# Patient Record
Sex: Male | Born: 1940 | Race: White | Hispanic: No | Smoking: Former smoker
Health system: Southern US, Community
[De-identification: ages and names within clinical notes are randomized; demographics above are authoritative.]

## PROBLEM LIST (undated history)

## (undated) DIAGNOSIS — I452 Bifascicular block: Secondary | ICD-10-CM

## (undated) DIAGNOSIS — D649 Anemia, unspecified: Secondary | ICD-10-CM

## (undated) DIAGNOSIS — N281 Cyst of kidney, acquired: Secondary | ICD-10-CM

## (undated) DIAGNOSIS — N400 Enlarged prostate without lower urinary tract symptoms: Secondary | ICD-10-CM

## (undated) DIAGNOSIS — I219 Acute myocardial infarction, unspecified: Secondary | ICD-10-CM

## (undated) DIAGNOSIS — IMO0001 Reserved for inherently not codable concepts without codable children: Secondary | ICD-10-CM

## (undated) DIAGNOSIS — S4292XA Fracture of left shoulder girdle, part unspecified, initial encounter for closed fracture: Secondary | ICD-10-CM

## (undated) DIAGNOSIS — K219 Gastro-esophageal reflux disease without esophagitis: Secondary | ICD-10-CM

## (undated) DIAGNOSIS — Z5189 Encounter for other specified aftercare: Secondary | ICD-10-CM

## (undated) DIAGNOSIS — N200 Calculus of kidney: Secondary | ICD-10-CM

## (undated) DIAGNOSIS — E785 Hyperlipidemia, unspecified: Secondary | ICD-10-CM

## (undated) DIAGNOSIS — I10 Essential (primary) hypertension: Secondary | ICD-10-CM

## (undated) DIAGNOSIS — I2510 Atherosclerotic heart disease of native coronary artery without angina pectoris: Secondary | ICD-10-CM

## (undated) HISTORY — DX: Bifascicular block: I45.2

## (undated) HISTORY — DX: Fracture of left shoulder girdle, part unspecified, initial encounter for closed fracture: S42.92XA

## (undated) HISTORY — PX: APPENDECTOMY: SHX54

## (undated) HISTORY — PX: OTHER SURGICAL HISTORY: SHX169

## (undated) HISTORY — DX: Anemia, unspecified: D64.9

## (undated) HISTORY — DX: Hyperlipidemia, unspecified: E78.5

## (undated) HISTORY — PX: FEMORAL NAIL REMOVAL: SUR1121

## (undated) HISTORY — DX: Benign prostatic hyperplasia without lower urinary tract symptoms: N40.0

## (undated) HISTORY — PX: CATARACT EXTRACTION: SUR2

## (undated) HISTORY — PX: HEMORRHOID SURGERY: SHX153

## (undated) HISTORY — DX: Atherosclerotic heart disease of native coronary artery without angina pectoris: I25.10

## (undated) HISTORY — DX: Cyst of kidney, acquired: N28.1

## (undated) HISTORY — DX: Essential (primary) hypertension: I10

## (undated) HISTORY — PX: FEMUR FRACTURE SURGERY: SHX633

## (undated) HISTORY — DX: Calculus of kidney: N20.0

---

## 1999-11-30 ENCOUNTER — Inpatient Hospital Stay (HOSPITAL_COMMUNITY): Admission: EM | Admit: 1999-11-30 | Discharge: 1999-12-02 | Payer: Self-pay | Admitting: Internal Medicine

## 1999-12-15 ENCOUNTER — Encounter (HOSPITAL_COMMUNITY): Admission: RE | Admit: 1999-12-15 | Discharge: 2000-02-07 | Payer: Self-pay | Admitting: Cardiology

## 2000-02-08 ENCOUNTER — Encounter (HOSPITAL_COMMUNITY): Admission: RE | Admit: 2000-02-08 | Discharge: 2000-05-08 | Payer: Self-pay | Admitting: Cardiology

## 2000-03-01 HISTORY — PX: CORONARY STENT PLACEMENT: SHX1402

## 2000-05-09 ENCOUNTER — Encounter (HOSPITAL_COMMUNITY): Admission: RE | Admit: 2000-05-09 | Discharge: 2000-08-07 | Payer: Self-pay | Admitting: Cardiology

## 2000-08-08 ENCOUNTER — Encounter (HOSPITAL_COMMUNITY): Admission: RE | Admit: 2000-08-08 | Discharge: 2000-11-06 | Payer: Self-pay | Admitting: Cardiology

## 2000-11-07 ENCOUNTER — Encounter (HOSPITAL_COMMUNITY): Admission: RE | Admit: 2000-11-07 | Discharge: 2001-02-05 | Payer: Self-pay | Admitting: Cardiology

## 2001-02-17 ENCOUNTER — Ambulatory Visit (HOSPITAL_COMMUNITY): Admission: RE | Admit: 2001-02-17 | Discharge: 2001-02-18 | Payer: Self-pay | Admitting: Cardiology

## 2001-03-01 ENCOUNTER — Encounter (HOSPITAL_COMMUNITY): Admission: RE | Admit: 2001-03-01 | Discharge: 2001-04-29 | Payer: Self-pay | Admitting: Cardiology

## 2003-03-02 HISTORY — PX: CARDIAC CATHETERIZATION: SHX172

## 2003-03-07 ENCOUNTER — Ambulatory Visit (HOSPITAL_COMMUNITY): Admission: RE | Admit: 2003-03-07 | Discharge: 2003-03-07 | Payer: Self-pay | Admitting: Pediatrics

## 2003-10-04 ENCOUNTER — Encounter (INDEPENDENT_AMBULATORY_CARE_PROVIDER_SITE_OTHER): Payer: Self-pay | Admitting: Specialist

## 2003-10-04 ENCOUNTER — Ambulatory Visit (HOSPITAL_COMMUNITY): Admission: RE | Admit: 2003-10-04 | Discharge: 2003-10-04 | Payer: Self-pay | Admitting: *Deleted

## 2008-07-17 ENCOUNTER — Encounter (HOSPITAL_COMMUNITY): Admission: RE | Admit: 2008-07-17 | Discharge: 2008-09-05 | Payer: Self-pay | Admitting: Gastroenterology

## 2008-12-10 ENCOUNTER — Encounter: Admission: RE | Admit: 2008-12-10 | Discharge: 2008-12-10 | Payer: Self-pay | Admitting: General Surgery

## 2008-12-12 ENCOUNTER — Ambulatory Visit (HOSPITAL_BASED_OUTPATIENT_CLINIC_OR_DEPARTMENT_OTHER): Admission: RE | Admit: 2008-12-12 | Discharge: 2008-12-13 | Payer: Self-pay | Admitting: General Surgery

## 2009-03-03 ENCOUNTER — Encounter: Admission: RE | Admit: 2009-03-03 | Discharge: 2009-03-03 | Payer: Self-pay | Admitting: Internal Medicine

## 2010-03-06 ENCOUNTER — Ambulatory Visit: Payer: Self-pay | Admitting: Cardiology

## 2010-03-06 ENCOUNTER — Encounter
Admission: RE | Admit: 2010-03-06 | Discharge: 2010-03-06 | Payer: Self-pay | Source: Home / Self Care | Attending: Internal Medicine | Admitting: Internal Medicine

## 2010-05-31 IMAGING — CR DG FEMUR 2V*L*
4 series · 4 of 4 positions shown · non-contrast
Comparison: None

CLINICAL DATA: Left thigh pain.

LEFT FEMUR - 2 VIEW

[view not recorded (1 of 4)]
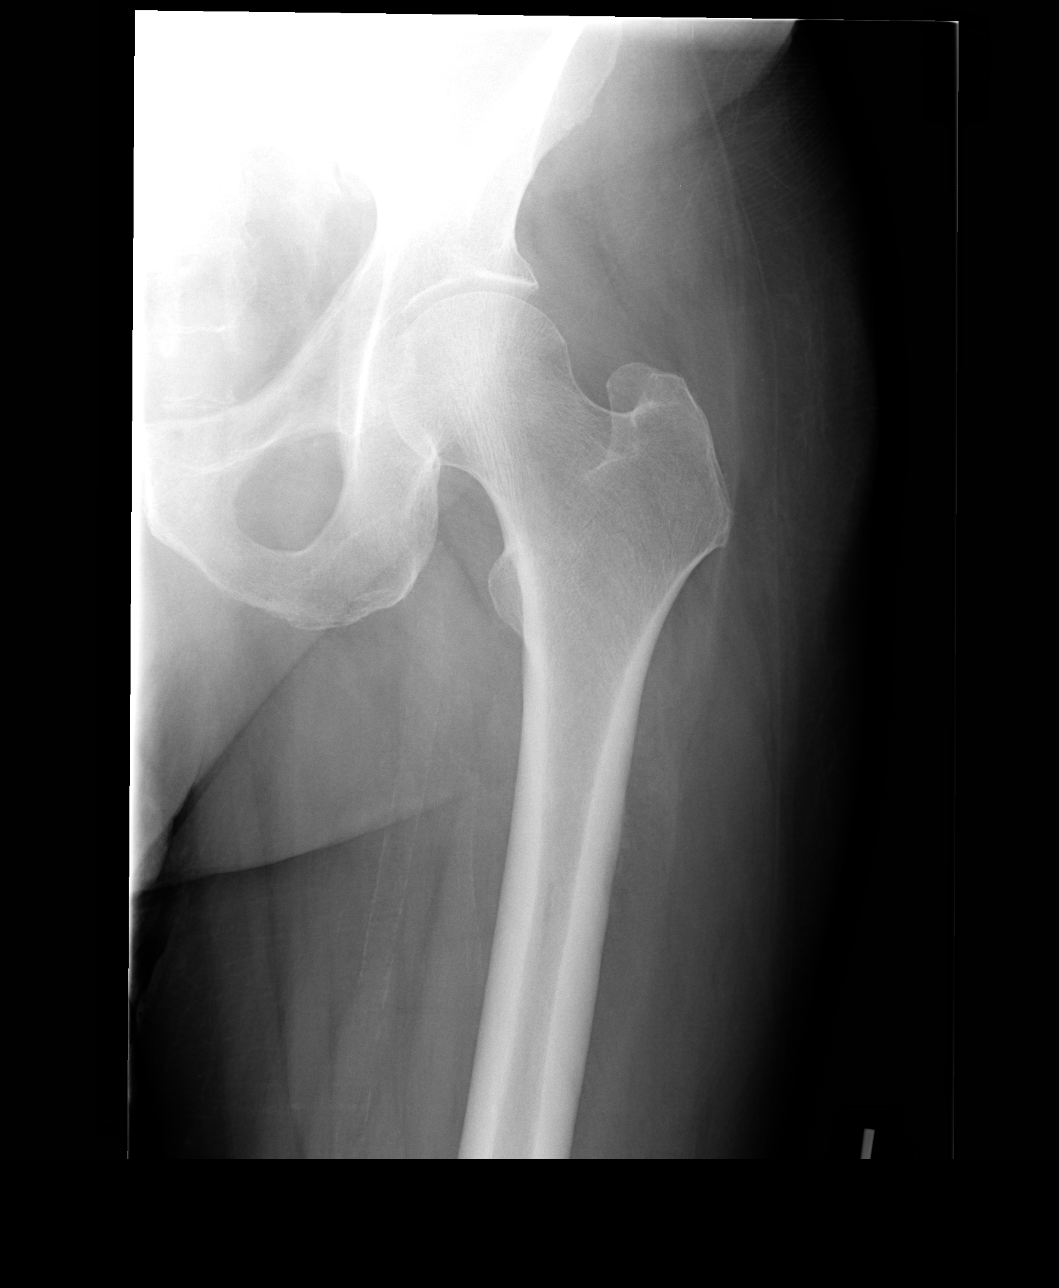

[view not recorded (2 of 4)]
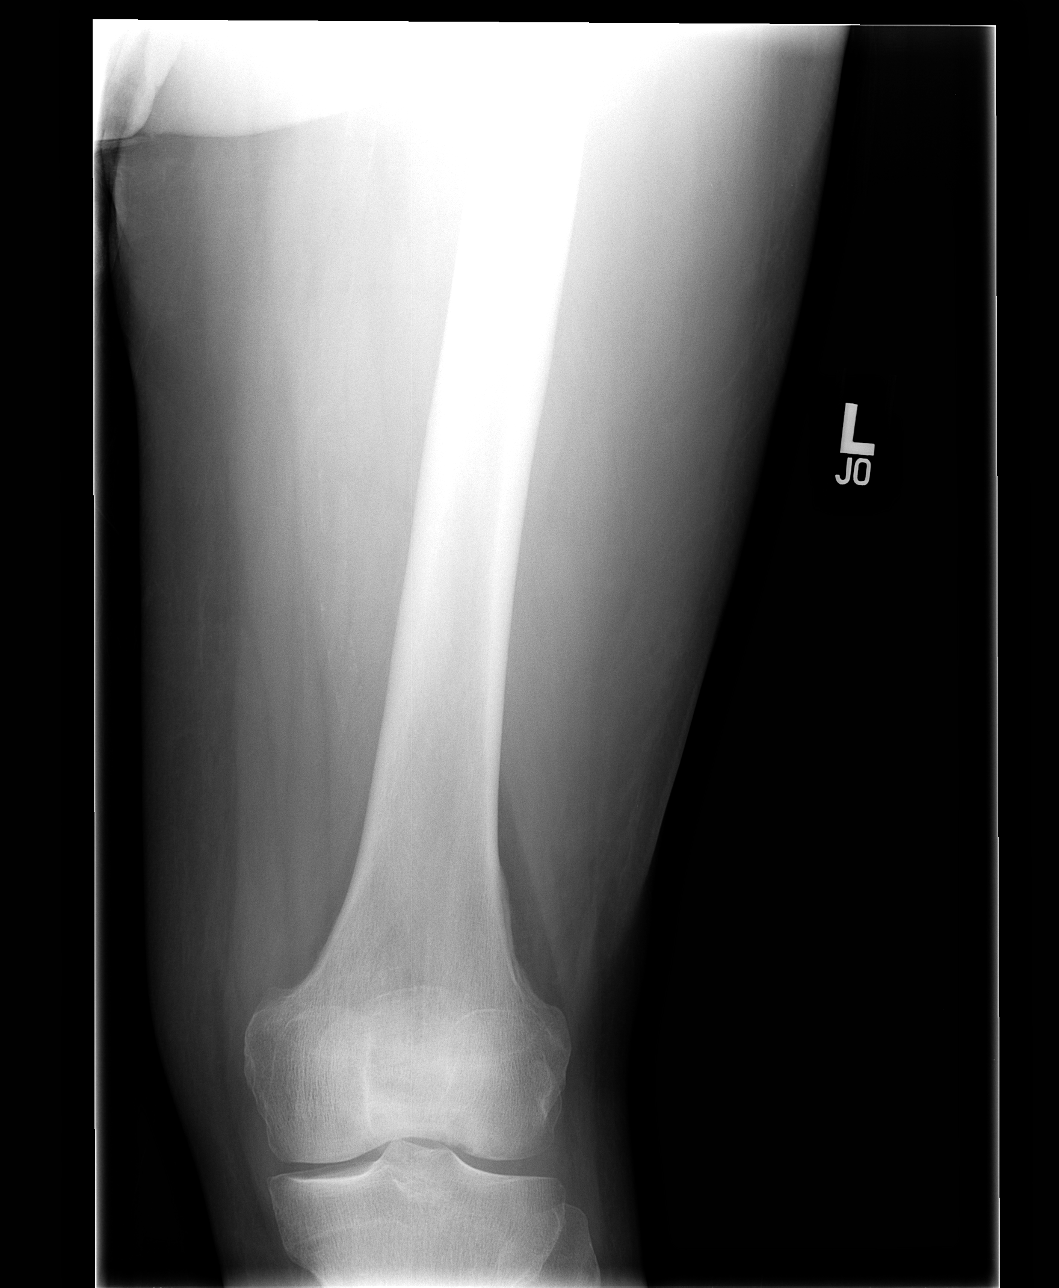

[view not recorded (3 of 4)]
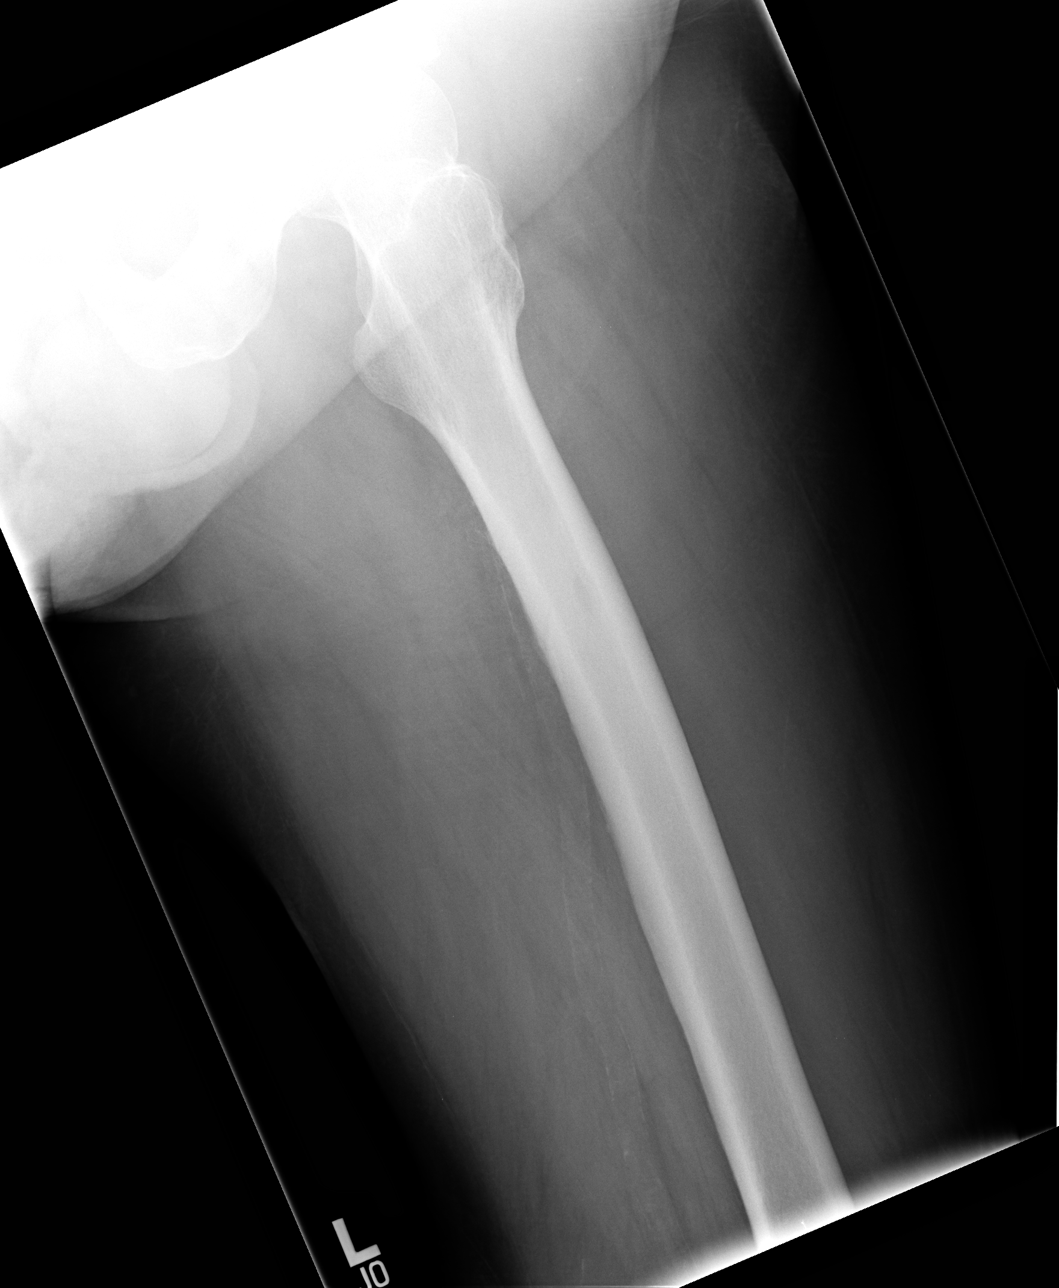

[view not recorded (4 of 4)]
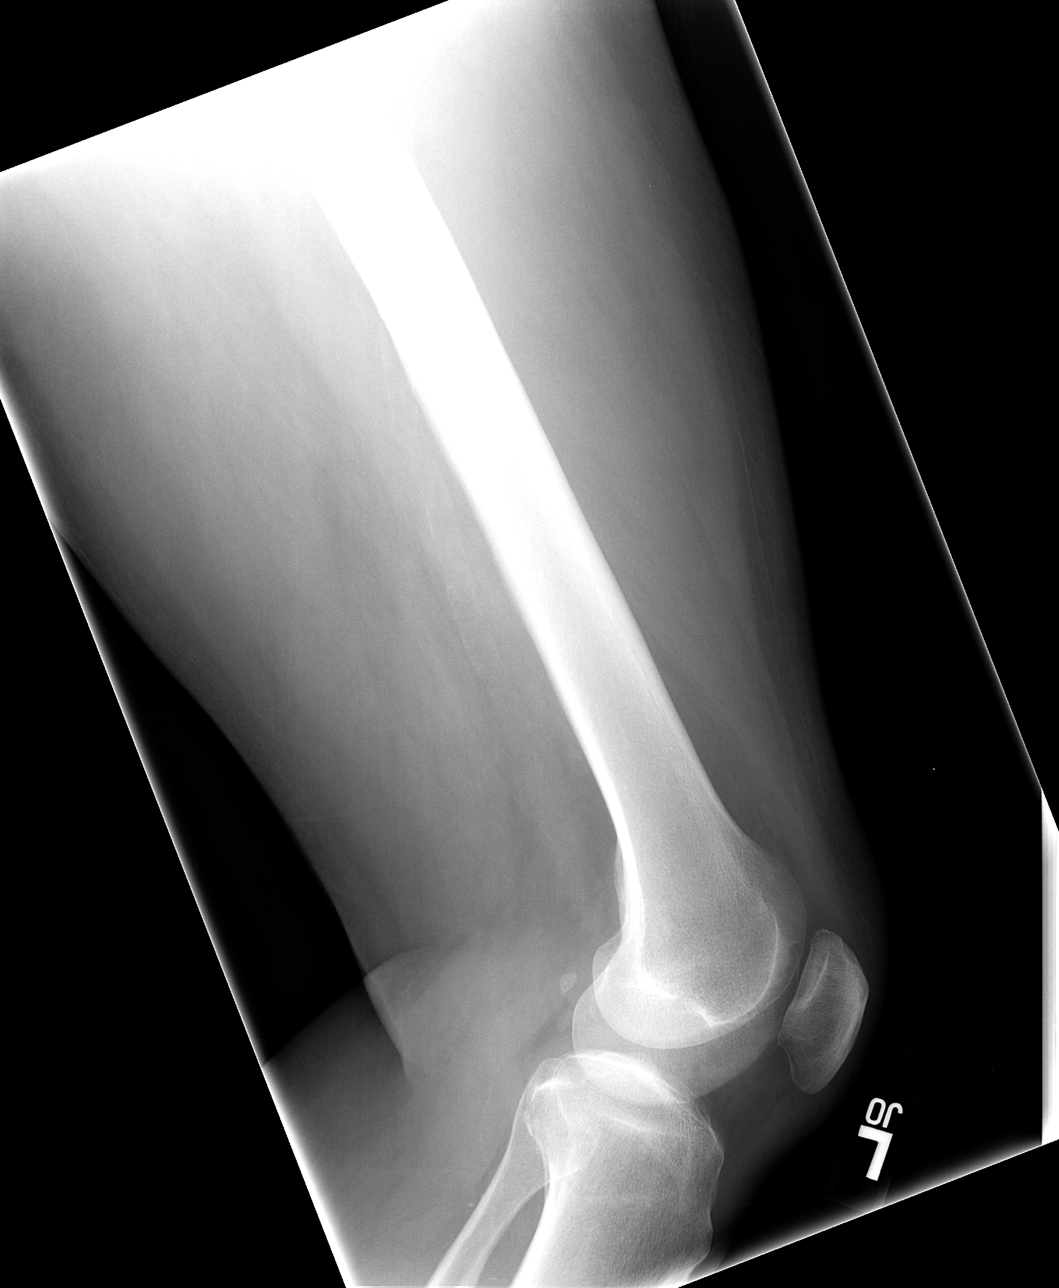

[4 of 4 positions shown; findings below may reference images not displayed]

FINDINGS: No fracture.  No lytic or sclerotic lesions. No
periosteal reaction.
IMPRESSION: Negative left femur.

## 2010-06-04 LAB — DIFFERENTIAL
Basophils Relative: 0 % (ref 0–1)
Lymphocytes Relative: 16 % (ref 12–46)
Monocytes Absolute: 0.5 10*3/uL (ref 0.1–1.0)
Neutrophils Relative %: 72 % (ref 43–77)

## 2010-06-04 LAB — BASIC METABOLIC PANEL
BUN: 13 mg/dL (ref 6–23)
Calcium: 8.9 mg/dL (ref 8.4–10.5)
Chloride: 110 mEq/L (ref 96–112)
Creatinine, Ser: 1.14 mg/dL (ref 0.4–1.5)
GFR calc Af Amer: >60 mL/min (ref >60)
GFR calc non Af Amer: >60 mL/min (ref >60)
Glucose, Bld: 145 mg/dL — ABNORMAL HIGH (ref 70–99)
Potassium: 3.7 mEq/L (ref 3.5–5.1)

## 2010-06-04 LAB — CBC
HCT: 36 % — ABNORMAL LOW (ref 39.0–52.0)
Hemoglobin: 11.9 g/dL — ABNORMAL LOW (ref 13.0–17.0)
MCHC: 33.1 g/dL (ref 30.0–36.0)
Platelets: 127 10*3/uL — ABNORMAL LOW (ref 150–400)
RBC: 4.4 MIL/uL (ref 4.22–5.81)
RDW: 16.6 % — ABNORMAL HIGH (ref 11.5–15.5)
WBC: 5.1 10*3/uL (ref 4.0–10.5)

## 2010-06-04 LAB — GLUCOSE, CAPILLARY
Glucose-Capillary: 149 mg/dL — ABNORMAL HIGH (ref 70–99)
Glucose-Capillary: 157 mg/dL — ABNORMAL HIGH (ref 70–99)
Glucose-Capillary: 157 mg/dL — ABNORMAL HIGH (ref 70–99)
Glucose-Capillary: 159 mg/dL — ABNORMAL HIGH (ref 70–99)

## 2010-06-08 LAB — CROSSMATCH: ABO/RH(D): O NEG

## 2010-06-08 LAB — GLUCOSE, CAPILLARY: Glucose-Capillary: 163 mg/dL — ABNORMAL HIGH (ref 70–99)

## 2010-06-09 LAB — CROSSMATCH: ABO/RH(D): O NEG

## 2010-06-09 LAB — ABO/RH: ABO/RH(D): O NEG

## 2010-06-09 LAB — GLUCOSE, CAPILLARY: Glucose-Capillary: 139 mg/dL — ABNORMAL HIGH (ref 70–99)

## 2010-06-25 ENCOUNTER — Other Ambulatory Visit: Payer: Self-pay | Admitting: Cardiology

## 2010-06-25 DIAGNOSIS — I10 Essential (primary) hypertension: Secondary | ICD-10-CM

## 2010-06-25 NOTE — Telephone Encounter (Signed)
Fax received from pharmacy. Refill completed. Jodette Naiya Corral RN  
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
## 2010-07-17 NOTE — Cardiovascular Report (Signed)
NAME:  Jesus [name], Jesus [name]                     ACCOUNT NO.:  [id number]   MEDICAL RECORD NO.:  [id number]                   PATIENT TYPE:  OIB   LOCATION:  2899                                 FACILITY:  [facility]   PHYSICIAN:  Peter M. Swaziland, M.D.               DATE OF BIRTH:  02-17-1941   DATE OF PROCEDURE:  03/07/2003  DATE OF DISCHARGE:                              CARDIAC CATHETERIZATION   INDICATION FOR PROCEDURE:  Jesus [name] is [name] with  history of diabetes and hyperlipidemia.  He has had previous coronary  interventions and presents with symptoms of chest discomfort.  Stress  Cardiolite study is somewhat equivocal, but suggestive of lateral wall  ischemia.   PROCEDURE:  Left heart catheterization, coronary angiography and left  ventricular angiography.   EQUIPMENT:  6-French 4 cm right and left Judkins catheter, 6-French pigtail  catheter, 6-French arterial sheath.   ACCESS:  Via the right femoral artery using standard Seldinger technique.   CONTRAST:  120 mL of Omnipaque.   MEDICATIONS:  Local anesthesia with 1% Xylocaine.   HEMODYNAMIC DATA:  Aortic pressure 129/74 with [name] mean of 98.  Left  ventricular pressure is 134 with EDP of 16 mmHg.   ANGIOGRAPHIC DATA:  The left coronary artery arises and distributes  normally.  The left main coronary artery is without significant disease.   The left anterior descending artery has [name] 40-50% stenosis in the mid vessel  past the take off of the first diagonal branch.  Remainder of LAD is without  significant disease.   The left circumflex coronary artery is [name] large co-dominant vessel. The  circumflex has diffuse atherosclerosis with less than or equal to 20%  stenoses.  The first marginal branch is still widely patent at the prior  stent site proximally.  There is [name] tiny side branch which has [name] 90% stenosis  which is unchanged to previous films.   The right coronary artery distributes normally.  Has  scattered  atherosclerosis throughout the vessel less than or equal to 20%.  The  previously stented site at the crux and previous angioplasty site in the  distal vessel are still widely patent.   LEFT VENTRICULAR ANGIOGRAPHY:  Performed in the RAO view.  This demonstrates  normal left ventricular size and contractility with normal systolic  function.  Ejection fraction is estimated at 65%.  There is mild mitral  valve regurgitation.   FINAL INTERPRETATION:  1. Nonobstructive atherosclerotic coronary artery disease.  There is     continued excellent long term patency of the     previously stented site in the proximal OM-1, the mid and distal right     coronary artery.  2. Normal left ventricular function.   PLAN:  Would recommend continued medical therapy.  Peter M. Swaziland, M.D.    PMJ/MEDQ  D:  03/07/2003  T:  03/07/2003  Job:  [id number]   cc:   Dr. Christiana Fuchs

## 2010-07-17 NOTE — Cardiovascular Report (Signed)
[place]. [facility]  Patient:    Jesus [name], Jesus [name]                  MRN: [id number] Proc. Date: 12/01/99 Adm. Date:  [id number] Attending:  Wilson Singer CC:         Lilly Cove, M.D.  Georg Ruddle. Viviann Spare, M.D.   Cardiac Catheterization  INDICATIONS FOR PROCEDURE:  The patient is a 70 year old white male who has a history of diabetes and hypertension who presents with crescendo angina.  ACCESS:  Via the right femoral artery and vein using the standard Seldinger technique.  EQUIPMENT:  A 6 French 4 cm right and left Judkins catheter, 6 French pigtail catheter, 6 French arterial sheath.  A 7 French arterial sheath, a 7 [place] JL4 guide, a 7 [place] guide with side holes, .014 Hi-Torque Floppy wire and a 2.5 x 15 mm Quantum Ranger balloon.  MEDICATIONS:  Local anesthesia with 1% Xylocaine.  Heparin a total of 5000 units IV with subsequent ACT of 245.  Integrilin double bolus of 16.6 mg followed by continuous infusion.  Nitroglycerin 200 mcg intracoronary x 4.  COMMENTARY:  The patient tolerated the procedure well without complications.  CONTRAST:  Omnipaque 275 cc.  HEMODYNAMIC DATA:  Aortic pressure is 111/63 with a mean of 84 mmHg.  Left ventricular pressure is 109 with an EDP of 11 mmHg.  ANGIOGRAPHIC DATA:  Left coronary artery:  The left coronary artery arises and distributes normally.  Left main:  The left main coronary artery has minor wall irregularities.  Left anterior descending:  The left anterior descending artery has a focal 50% stenosis following the takeoff in the first diagonal branch.  Otherwise there are only minor scattered irregularities.  Left circumflex:  The left circumflex coronary artery is a codominant vessel. The first obtuse marginal vessel has an 80-90% stenosis that is focal in the proximal vessel.  There is a small side branch to the first marginal vessel which measures less than 1 mm in diameter which  has a 95% stenosis.  The ongoing left circumflex gives rise to a very large terminal marginal vessel which is without significant disease.  Right coronary artery:  The right coronary artery is the small codominant vessel.  It has diffuse small irregularities up to 20%.  The mid vessel following the right ventricular marginal takeoff.  There is an 80-90% stenosis in the right coronary artery.  LEFT VENTRICULAR ANGIOGRAPHY:  The left ventricular angiography is performed in the RAO view.  This demonstrates normal left ventricular size and contractility with normal systolic function.  The ejection fraction is calculated at 65%.  We proceeded with angioplasty of both the first obtuse marginal and right coronary artery branches.  We used the same wire and balloon system for both lesions.  We initially addressed the first obtuse marginal vessel.  This was crossed and dilated at 6 and then 12 atmospheres.  This clinically reproduced the patients symptoms.  After two inflations an excellent angiographic result was obtained with 0% residual stenosis.  We then addressed the right coronary artery lesion.  This was also easily crossed and dilated initially at 8 and then 12 atmospheres.  Then with a prolonged inflation of 10 atmospheres to 3 minutes.  This resulted in focal plaque disruption that was nonobstructive and less than 20% residual stenosis.  At this point the patient was pain-free and hemodynamically stable. FINAL INTERPRETATION: 1. Two-vessel obstructive atherosclerotic coronary artery disease. 2. Normal left ventricular  function. 3. Successful percutaneous transluminal coronary angioplasty of the first    obtuse marginal branch and of the mid right coronary artery. DD:  12/01/99 TD:  12/01/99 Job: [id number]

## 2010-07-17 NOTE — H&P (Signed)
[place]. [facility]  Patient:    Jesus [name], Jesus [name]                  MRN: [id number] Adm. Date:  [id number] Attending:  Wilson Singer CC:         Georg Ruddle. Viviann Spare, M.D.                         History and Physical  HISTORY OF PRESENT ILLNESS:  This is a very pleasant 70 year old diabetic, hypertensive man who has had unstable angina for the last week or so.  He has seen Dr. Peter Swaziland who had scheduled for him to have angiography today, but a hemoglobin was reduced at 8.5.  He tells me that he has had extensive bleeding from his hemorrhoids in the last two weeks.  He did have a colonoscopy one to two years ago which was normal and no evidence of malignancy or colonic lesions.  He is a diabetic.  MEDICATIONS: 1. Glyburide 10 mg per day. 2. Glucophage 500 mg per day. 3. Ziac one tablet per day. 4. Aspirin one tablet per day. 5. Multivitamins one tablet per day.  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  He does not smoke and he does not drink alcohol.  FAMILY HISTORY:  Not contributing to his history.  ALLERGIES:  CAMFER, ONIONS.  REVIEW OF SYSTEMS:  Apart from the symptoms mentioned above, he has no other symptoms referrable to the HEENT, respiratory, cardiovascular, neurological, musculoskeletal, gastroenterology, genitourinary systems.  PHYSICAL EXAMINATION:  VITAL SIGNS:  His pulse oximetry on room air is 97%.  Blood pressure 140/70, pulse 72.  GENERAL:  He looks systemically well, but he is pale.  HEART:  Heart sounds are present and normal with no murmurs or gallop rhythm. JVP is not raised.  LUNGS:  Lung fields are clear.  ABDOMEN:  Soft and nontender with no hepatosplenomegaly.  RECTAL:  Not done, but we will get a stool for hemoccults.  NEUROLOGICAL:  He is alert and oriented with no focal signs.  INVESTIGATIONS:  Blood work is pending.  IMPRESSION: 1. Angina, unstable.  We will get cardiology,  Dr. Peter Swaziland to see him    as he had been dealing with him previously. 2. Anemia.  We will need to transfuse him and I think the cause of his    anemia is likely to be due to his bleeding from his hemorrhoids and this    blood transfusion will also help his angina. 3. Non-insulin-dependent diabetes mellitus.  Will keep him on Glyburide and    stop his Glucophage in anticipation of his angiography and also put him    on a sliding scale of insulin.  We will start him on Altace 2.5 mg q.d.    as he is a diabetic.  Further recommendations will depend on progress. DD:  11/30/99 TD:  11/30/99 Job: [id number]
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
## 2010-07-17 NOTE — Cardiovascular Report (Signed)
[place]. [facility]  Patient:    Jesus [name], Jesus [name] Visit Number: [id number] MRN: [id number]          Service Type: CAT Location: [id number] Attending Physician:  Swazil[place], Peter Manning Dictated by:   Peter M. [place]aziland, M.D. Proc. Date: 02/17/01 Admit Date:  02/17/2001 Discharge Date: 02/18/2001   CC:         Lilly Cove, M.D.   Cardiac Catheterization  PROCEDURE:  Cardiac catheterization.  CARDIOLOGIST:  Peter M. Swaziland, M.D.  INDICATION FOR PROCEDURE: The patient is a 70 year old white male with a history of diabetes and hyperlipidemia who presents with recurrent angina.  He has had prior angioplasty of the right coronary artery and obtuse marginal vessels in October 2001.  ACCESS:  Via the right femoral artery and vein using standard Seldinger technique.  EQUIPMENT:  Use of 6-French 4 cm right and left Judkins catheters, 6-French pigtail catheter, 6-French arterial sheath, 7-French arterial sheath, 7-French JL4 guide, 7-French JR4 guide with side holes, 0.014 high-torque floppy wire, a 2.5 x 15 mm CrossSail balloon, and a 2.5 x 13 mm Pixel stent x 2.  HEMODYNAMIC DATA:  Aortic pressure was 116/65 with a mean of 89.  Left ventricular pressure was 116 with an EDP of 10.  MEDICATIONS:  Local anesthesia with 1% Xylocaine, heparin 4500 units IV, nitroglycerin 200 mcg intracoronary x 2, nitroglycerin drip at 3 drops per minute IV, Integrilin double bolus followed by continuous IV infusion, Plavix 300 mg p.o., Versed 2 mg IV.  CONTRAST:  250 cc Omnipaque.  ANGIOGRAPHIC DATA:  Left coronary artery arises and distributes normally.  It is a codominant vessel.  Left main coronary artery has moderate wall irregularities of less than 10%.  Left anterior descending artery has a 30 to 40% stenosis just past the takeoff of the first diagonal branch.  The remainder of the vessel is without significant disease.  The left circumflex coronary  artery is a large codominant vessel.  the first marginal branch has a 90% stenosis proximally which is a restenosis of prior angioplasty site.  There is a very small side branch which also has a 90% stenosis.  This branch is less than 1 mm in diameter.  The right coronary artery arises normally.  It has scattered plaque throughout the vessel.  At the crux, there is a 90% stenosis which is also a restenosis. In the distal vessel, there is a 50% stenosis.  Left ventricular angiography was performed in the RAO view.  This demonstrates normal left ventricular size and contractility with normal systolic function. Ejection fraction is estimated at 65%.  There is no mitral regurgitation or prolapse.  INTERVENTION:  We proceeded at this point with intervention of both the first obtuse marginal vessel and the right coronary artery.  We initially addressed the obtuse marginal branch.  This was crossed easily with the wire, and we performed predilatation with a 2.5 mm CrossSail balloon, dilating up to a maximum of 8 atmospheres.  This was followed with stent using a 2.5 x 13 mm Pixel stent.  It was deployed at 7 atmospheres and then post dilated at 14 atmospheres.  This yielded an excellent angiographic result with 0% residual stenosis.  We then proceed with intervention on the right coronary artery.  Similar equipment was used.  The lesion was crossed easily with the wire, and we initially performed dilatation using the 2.5 mm CrossSail balloon, dilating in the distal vessel to 6 atmospheres and the crux at 8  atmospheres.  This yielded excellent result distally with 0% residual stenosis.  We then placed a stent to the crux using a 2.5 x 13 mm Pixel stent.  Again, this was deployed at 7 atmospheres and then post dilated to 14 atmospheres.  This yielded an excellent angiographic result with 0% residual stenosis.  FINAL INTERPRETATION: 1. Two-vessel obstructive atherosclerotic coronary artery  disease. 2. Normal left ventricular function. 3. Successful stenting of the first obtuse marginal vessel and the right    coronary artery at the crux. Dictated by:   Peter M. Swaziland, M.D. Attending Physician:  [place], Peter Manning DD:  02/17/01 TD:  02/18/01 Job: [id number]

## 2010-08-14 ENCOUNTER — Encounter: Payer: Self-pay | Admitting: *Deleted

## 2010-08-21 ENCOUNTER — Encounter: Payer: Self-pay | Admitting: Cardiology

## 2010-08-21 ENCOUNTER — Ambulatory Visit (INDEPENDENT_AMBULATORY_CARE_PROVIDER_SITE_OTHER): Payer: Medicare Other | Admitting: Cardiology

## 2010-08-21 DIAGNOSIS — L02211 Cutaneous abscess of abdominal wall: Secondary | ICD-10-CM | POA: Insufficient documentation

## 2010-08-21 DIAGNOSIS — L02219 Cutaneous abscess of trunk, unspecified: Secondary | ICD-10-CM

## 2010-08-21 DIAGNOSIS — I10 Essential (primary) hypertension: Secondary | ICD-10-CM

## 2010-08-21 DIAGNOSIS — E119 Type 2 diabetes mellitus without complications: Secondary | ICD-10-CM

## 2010-08-21 DIAGNOSIS — I2510 Atherosclerotic heart disease of native coronary artery without angina pectoris: Secondary | ICD-10-CM | POA: Insufficient documentation

## 2010-08-21 DIAGNOSIS — I452 Bifascicular block: Secondary | ICD-10-CM

## 2010-08-21 DIAGNOSIS — E785 Hyperlipidemia, unspecified: Secondary | ICD-10-CM | POA: Insufficient documentation
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
## 2010-08-21 NOTE — Assessment & Plan Note (Signed)
Blood pressure is under excellent control. 

## 2010-08-21 NOTE — Assessment & Plan Note (Signed)
He had a normal stress Cardiolite study in July of 2011. He is asymptomatic. We will continue with his current medical therapy and risk factor modification. I will followup again in 6 months.

## 2010-08-21 NOTE — Patient Instructions (Signed)
Continue current medications.  Get regular exercise.  I will see you again in 6 months.

## 2010-08-21 NOTE — Assessment & Plan Note (Signed)
His most recent lipid panel showed a total cholesterol of 112, LDL of 66, and triglycerides of 87. HDL was low at 29. We will continue with his current medical regimen.

## 2010-08-21 NOTE — Assessment & Plan Note (Signed)
Last A1c was 7.1%. He continues to work on his weight control.

## 2010-09-22 ENCOUNTER — Encounter (HOSPITAL_BASED_OUTPATIENT_CLINIC_OR_DEPARTMENT_OTHER): Payer: Medicare Other | Attending: General Surgery

## 2010-09-22 DIAGNOSIS — L02419 Cutaneous abscess of limb, unspecified: Secondary | ICD-10-CM | POA: Insufficient documentation

## 2010-09-22 DIAGNOSIS — S71009A Unspecified open wound, unspecified hip, initial encounter: Secondary | ICD-10-CM | POA: Insufficient documentation

## 2010-09-22 DIAGNOSIS — S71109A Unspecified open wound, unspecified thigh, initial encounter: Secondary | ICD-10-CM | POA: Insufficient documentation

## 2010-09-22 DIAGNOSIS — X58XXXA Exposure to other specified factors, initial encounter: Secondary | ICD-10-CM | POA: Insufficient documentation

## 2010-09-22 DIAGNOSIS — L03119 Cellulitis of unspecified part of limb: Secondary | ICD-10-CM | POA: Insufficient documentation

## 2010-09-22 DIAGNOSIS — A4902 Methicillin resistant Staphylococcus aureus infection, unspecified site: Secondary | ICD-10-CM | POA: Insufficient documentation

## 2010-09-22 DIAGNOSIS — E7800 Pure hypercholesterolemia, unspecified: Secondary | ICD-10-CM | POA: Insufficient documentation

## 2010-09-22 DIAGNOSIS — I10 Essential (primary) hypertension: Secondary | ICD-10-CM | POA: Insufficient documentation

## 2010-09-22 DIAGNOSIS — E119 Type 2 diabetes mellitus without complications: Secondary | ICD-10-CM | POA: Insufficient documentation

## 2010-09-22 DIAGNOSIS — Z79899 Other long term (current) drug therapy: Secondary | ICD-10-CM | POA: Insufficient documentation

## 2010-09-23 ENCOUNTER — Ambulatory Visit (INDEPENDENT_AMBULATORY_CARE_PROVIDER_SITE_OTHER): Payer: Medicare Other | Admitting: General Surgery

## 2010-09-23 ENCOUNTER — Encounter (INDEPENDENT_AMBULATORY_CARE_PROVIDER_SITE_OTHER): Payer: Self-pay | Admitting: General Surgery

## 2010-09-23 VITALS — BP 126/64 | HR 100 | Temp 96.2°F | Ht 68.0 in | Wt 214.8 lb

## 2010-09-23 DIAGNOSIS — M6789 Other specified disorders of synovium and tendon, multiple sites: Secondary | ICD-10-CM

## 2010-09-23 DIAGNOSIS — M65052 Abscess of tendon sheath, left thigh: Secondary | ICD-10-CM

## 2010-09-23 NOTE — Progress Notes (Signed)
Subjective:     Patient ID: Jesus [name], male   DOB: 1940/07/04, 70 y.o.   MRN: [id number]  HPI  This is a 70 year old diabetic gentleman, referred to me by 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 He is followed by his primary care physician, Dr. Salome Spotted and Dr. Peter [place], his cardiologist.  Apparently he's had problems with cellulitis and infection of the left lateral thigh. He's had some minor incision and drainage proceedures performed and some packing procedures. Apparently this infection has improved and worsened. He has been on clindamycin. Dr. Wiliam Ke sent him to me today for an opinion.   The patient has not had any fever or chills. He is not toxic in any way. He says it's a little bit tender but difficult to pack the wound. He is here with his daughter.  Past Medical History  Diagnosis Date  . Diabetes mellitus   . Hypertension   . Hyperlipidemia   . Coronary artery disease     with prior stenting of the mid and distal right coronary and the obtuse marginal vessels in 2002  . RBBB (right bundle branch block with left anterior fascicular block)   . Anemia   . Bleeding hemorrhoid   . BPH (benign prostatic hyperplasia)   . Renal calculus   . Renal cyst   . Abdominal wall abscess     May 2010    Current Outpatient Prescriptions  Medication Sig Dispense Refill  . aspirin 81 MG tablet Take 81 mg by mouth daily.        Marland Kitchen atorvastatin (LIPITOR) 20 MG tablet Take 20 mg by mouth daily.        . B Complex-C-Min-Fe-FA (HEMATINIC PLUS COMPLEX PO) Take by mouth daily.        . clindamycin (CLEOCIN) 300 MG capsule Take 300 mg by mouth 3 (three) times daily.        . enalapril (VASOTEC) 20 MG tablet Take 5 mg by mouth daily.       . Ferrous Sulfate (IRON) 325 (65 FE) MG TABS Take by mouth daily.        Marland Kitchen glyBURIDE-metformin (GLUCOVANCE) 5-500 MG per tablet Take 2 tablets by mouth 2 (two) times daily with a meal.          . Insulin Lispro, Human, (HUMALOG KWIKPEN [place]) Inject into the skin. Sample given by Dr. Harless Nakayama (sp?)       . multivitamin [facility]) per tablet Take 1 tablet by mouth daily.        . niacin (NIASPAN) 500 MG CR tablet Take 500 mg by mouth at bedtime.        . pioglitazone (ACTOS) 45 MG tablet Take 45 mg by mouth daily.        . sitaGLIPtan (JANUVIA) 100 MG tablet Take 100 mg by mouth daily.        . Tamsulosin HCl (FLOMAX) 0.4 MG CAPS Take 0.4 mg by mouth daily.        . metoprolol (LOPRESSOR) 50 MG tablet TAKE 1 TABLET TWICE A DAY  180 tablet  2  . nitroGLYCERIN (NITROSTAT) 0.4 MG SL tablet Place 0.4 mg under the tongue every 5 (five) minutes as needed.          Allergies  Allergen Reactions  . Camphor Other (See Comments)    Area applied feels like a match has been placed on the skin.  . Menthol Other (  See Comments)    Area applied feels like a match as been placed on the skin.    Family History  Problem Relation Age of Onset  . Heart attack Father   . Heart disease Father   . Cancer Mother   . Diabetes Mother   . Heart disease Brother     History  Substance Use Topics  . Smoking status: Former Smoker    Types: Cigarettes    Quit date: 08/13/1976  . Smokeless tobacco: Not on file  . Alcohol Use: No     Review of Systems 10 system review of systems is performed and is negative except as described above.    Objective:   Physical Exam  Constitutional: He is oriented to person, place, and time. He appears well-developed and well-nourished. No distress.  HENT:  Head: Normocephalic and atraumatic.  Neck: Neck supple.  Musculoskeletal: He exhibits tenderness. He exhibits no edema.       Left lateral thigh reveals four open areas. One of these is about 1 cm., another is about 5 mm in size. More laterally there are 2 punctate openings. There is no erythema or cellulitis. I passed a hemostat into the wounds and they all communicated. This area was anesthetized with  Xylocaine, and I opened up all of the areas.There is no deep abscess.  here was no odor.  Lymphadenopathy:    He has no cervical adenopathy.  Neurological: He is oriented to person, place, and time. He exhibits normal muscle tone.  Skin: Skin is warm and dry. No rash noted. He is not diaphoretic. No erythema. No pallor.  Psychiatric: He has a normal mood and affect. His behavior is normal. Judgment and thought content normal.       Assessment:     Chronic abscess left lateral thigh.  Diabetes mellitus.  Hypertension.  coronary artery disease.  chronic anemia.    Plan:     Complex incision and drainage of chronic abscess left lateral performed an office today under local anesthesia. The patient tolerated this well.  The wound was packed with saline moistened 4 x 4 gauze.  Arrangements are made for home health nursing to come to the house to begin twice daily dressing changes. The daughter may be taught how to do this eventually.  Discontinue clindamycin.  Doxycycline 100 mg twice a day for the next 15 days. Prescription was written and given to patient  The patient will return to see Dr. Wiliam Ke at the wound center next week to resume dressing changes and wound care.  Return to [facility] surgery p.r.n.

## 2010-09-23 NOTE — Patient Instructions (Signed)
You had a complex abscess of the lateral left thigh. There were 4 separate openings and I cut all of these open so that there is now a single wound that can be more easily packed with gauze. I am going to arrange for home health nursing with [facility] agency to educate and supervise dressing changes twice a day. This is to be done with fine mesh gauze and saline only. Your to return to see Dr. Joanne Gavel at the wound clinic in 5-7 days to resume the care at that facility. You are asked to discontinue the clindamycin, and I have given you a prescription for doxycycline 100 mg to take twice a day for the next 15 days. If  Dr. Wiliam Ke would like [place] to see you in the future, we will be happy to do so. Please ask him to give [place] a call prior to the next office visit. Otherwise see [place] on a p.r.n. basis.

## 2010-09-29 DIAGNOSIS — L02419 Cutaneous abscess of limb, unspecified: Secondary | ICD-10-CM

## 2010-09-29 DIAGNOSIS — A4902 Methicillin resistant Staphylococcus aureus infection, unspecified site: Secondary | ICD-10-CM

## 2010-10-06 ENCOUNTER — Other Ambulatory Visit (HOSPITAL_BASED_OUTPATIENT_CLINIC_OR_DEPARTMENT_OTHER): Payer: Self-pay | Admitting: General Surgery

## 2010-10-06 ENCOUNTER — Encounter (HOSPITAL_BASED_OUTPATIENT_CLINIC_OR_DEPARTMENT_OTHER): Payer: Medicare Other | Attending: General Surgery

## 2010-10-06 ENCOUNTER — Encounter: Payer: Self-pay | Admitting: Internal Medicine

## 2010-10-06 ENCOUNTER — Ambulatory Visit (INDEPENDENT_AMBULATORY_CARE_PROVIDER_SITE_OTHER): Payer: Medicare Other | Admitting: Internal Medicine

## 2010-10-06 VITALS — BP 133/74 | HR 103 | Temp 97.4°F | Ht 68.0 in | Wt 214.0 lb

## 2010-10-06 DIAGNOSIS — Z79899 Other long term (current) drug therapy: Secondary | ICD-10-CM | POA: Insufficient documentation

## 2010-10-06 DIAGNOSIS — E7800 Pure hypercholesterolemia, unspecified: Secondary | ICD-10-CM | POA: Insufficient documentation

## 2010-10-06 DIAGNOSIS — I10 Essential (primary) hypertension: Secondary | ICD-10-CM | POA: Insufficient documentation

## 2010-10-06 DIAGNOSIS — X58XXXA Exposure to other specified factors, initial encounter: Secondary | ICD-10-CM | POA: Insufficient documentation

## 2010-10-06 DIAGNOSIS — S71109A Unspecified open wound, unspecified thigh, initial encounter: Secondary | ICD-10-CM | POA: Insufficient documentation

## 2010-10-06 DIAGNOSIS — L03119 Cellulitis of unspecified part of limb: Secondary | ICD-10-CM | POA: Insufficient documentation

## 2010-10-06 DIAGNOSIS — E119 Type 2 diabetes mellitus without complications: Secondary | ICD-10-CM | POA: Insufficient documentation

## 2010-10-06 DIAGNOSIS — L02419 Cutaneous abscess of limb, unspecified: Secondary | ICD-10-CM | POA: Insufficient documentation

## 2010-10-06 DIAGNOSIS — A4902 Methicillin resistant Staphylococcus aureus infection, unspecified site: Secondary | ICD-10-CM

## 2010-10-06 DIAGNOSIS — S71009A Unspecified open wound, unspecified hip, initial encounter: Secondary | ICD-10-CM | POA: Insufficient documentation

## 2010-10-06 MED ORDER — MUPIROCIN 2 % EX OINT: TOPICAL_OINTMENT | CUTANEOUS | Status: AC

## 2010-10-06 NOTE — Assessment & Plan Note (Signed)
I suspect that his most recent bout of MRSA soft tissue infection is now cured following the 2 incision and drainage procedures and one month of antibiotics. I see no evidence of any active infection at this time but he will still taking time to heal his surgical wound. There no good evidence based guidelines on what to do to try to prevent recurrences of community-acquired MRSA infections but attempts at decolonization are often tried. He is already using an antibacterial soap for handwashing. I suggested he use that also for general bathing and also gets some alcohol and the gel or foam to use throughout the day for hand hygiene. I will also prescribe mupirocin to use intranasally for 5 days.

## 2010-10-06 NOTE — Progress Notes (Signed)
  Subjective:    Patient ID: Jesus [name], male    DOB: 1941/02/04, 70 y.o.   MRN: [id number]  HPI Jesus [name] is a 70 year old gentleman who is referred to me by Dr. Joanne Gavel for evaluation of a left side MRSA abscess. He states that about 5 or 6 years ago he had a MRSA abscess in his right groin that resolved after I&D. He had no further problems with soft tissue infection until he noted pain, redness and swelling of his left thigh on July 5. He does not recall any injury to his thigh. He saw his primary care doctor, Dr. Cindee Lame, on July 9 and underwent incision and drainage. He believes he was started on clindamycin at that time although records suggest he was probably started on Septra. He was referred to Dr. Wiliam Ke at the wound care center referred him to Dr. Derrell Lolling. He believes he saw Dr. Derrell Lolling around July 18 to extended the incision for more complete drainage. He is now on doxycycline and has been on antibiotics ever since July 9. He is feeling better. I called [facility] lab and found out that a culture her on July 13 grew abundant MRSA. A repeat culture on July 24 was no growth.    Review of Systems     Objective:   Physical Exam  Constitutional: No distress.  Cardiovascular: Normal rate, regular rhythm and normal heart sounds.   No murmur heard. Pulmonary/Chest: Breath sounds normal. He has no wheezes. He has no rales.  Musculoskeletal:       There is an open wound on his left lateral thigh measuring about 8 x 5 cm. There is 100% pink granulation tissue covering the wound I do not notice any tunneling or adjacent fluctuance. There is no odor or no surrounding cellulitis.          Assessment & Plan:

## 2010-10-31 HISTORY — PX: ORIF SHOULDER DISLOCATION W/ HUMERAL FRACTURE: SUR959

## 2010-11-03 ENCOUNTER — Encounter (HOSPITAL_BASED_OUTPATIENT_CLINIC_OR_DEPARTMENT_OTHER): Payer: Medicare Other | Attending: General Surgery

## 2010-11-03 DIAGNOSIS — I10 Essential (primary) hypertension: Secondary | ICD-10-CM | POA: Insufficient documentation

## 2010-11-03 DIAGNOSIS — E7800 Pure hypercholesterolemia, unspecified: Secondary | ICD-10-CM | POA: Insufficient documentation

## 2010-11-03 DIAGNOSIS — N400 Enlarged prostate without lower urinary tract symptoms: Secondary | ICD-10-CM | POA: Insufficient documentation

## 2010-11-03 DIAGNOSIS — D509 Iron deficiency anemia, unspecified: Secondary | ICD-10-CM | POA: Insufficient documentation

## 2010-11-03 DIAGNOSIS — S71009A Unspecified open wound, unspecified hip, initial encounter: Secondary | ICD-10-CM | POA: Insufficient documentation

## 2010-11-03 DIAGNOSIS — Z79899 Other long term (current) drug therapy: Secondary | ICD-10-CM | POA: Insufficient documentation

## 2010-11-03 DIAGNOSIS — X58XXXA Exposure to other specified factors, initial encounter: Secondary | ICD-10-CM | POA: Insufficient documentation

## 2010-11-03 DIAGNOSIS — S71109A Unspecified open wound, unspecified thigh, initial encounter: Secondary | ICD-10-CM | POA: Insufficient documentation

## 2010-11-03 DIAGNOSIS — E119 Type 2 diabetes mellitus without complications: Secondary | ICD-10-CM | POA: Insufficient documentation

## 2010-11-14 ENCOUNTER — Emergency Department (HOSPITAL_COMMUNITY): Payer: Medicare Other

## 2010-11-14 ENCOUNTER — Emergency Department (HOSPITAL_COMMUNITY)
Admission: EM | Admit: 2010-11-14 | Discharge: 2010-11-14 | Disposition: A | Discharge status: Home / Self Care | Payer: Medicare Other | Attending: Emergency Medicine | Admitting: Emergency Medicine

## 2010-11-14 DIAGNOSIS — S42209A Unspecified fracture of upper end of unspecified humerus, initial encounter for closed fracture: Secondary | ICD-10-CM | POA: Insufficient documentation

## 2010-11-14 DIAGNOSIS — W010XXA Fall on same level from slipping, tripping and stumbling without subsequent striking against object, initial encounter: Secondary | ICD-10-CM | POA: Insufficient documentation

## 2010-11-14 DIAGNOSIS — M25519 Pain in unspecified shoulder: Secondary | ICD-10-CM | POA: Insufficient documentation

## 2010-11-14 DIAGNOSIS — I10 Essential (primary) hypertension: Secondary | ICD-10-CM | POA: Insufficient documentation

## 2010-11-14 DIAGNOSIS — E119 Type 2 diabetes mellitus without complications: Secondary | ICD-10-CM | POA: Insufficient documentation

## 2010-11-14 DIAGNOSIS — IMO0002 Reserved for concepts with insufficient information to code with codable children: Secondary | ICD-10-CM | POA: Insufficient documentation

## 2010-11-14 DIAGNOSIS — M25569 Pain in unspecified knee: Secondary | ICD-10-CM | POA: Insufficient documentation

## 2010-11-14 DIAGNOSIS — I2510 Atherosclerotic heart disease of native coronary artery without angina pectoris: Secondary | ICD-10-CM | POA: Insufficient documentation

## 2010-11-16 ENCOUNTER — Encounter (HOSPITAL_BASED_OUTPATIENT_CLINIC_OR_DEPARTMENT_OTHER)
Admission: RE | Admit: 2010-11-16 | Discharge: 2010-11-16 | Disposition: A | Discharge status: Home / Self Care | Payer: Medicare Other | Source: Ambulatory Visit | Attending: Orthopedic Surgery | Admitting: Orthopedic Surgery

## 2010-11-16 LAB — BASIC METABOLIC PANEL
Calcium: 9.5 mg/dL (ref 8.4–10.5)
GFR calc non Af Amer: 38 mL/min — ABNORMAL LOW (ref >60)
Glucose, Bld: 128 mg/dL — ABNORMAL HIGH (ref 70–99)
Sodium: 137 mEq/L (ref 135–145)

## 2010-11-17 ENCOUNTER — Ambulatory Visit (HOSPITAL_COMMUNITY)
Admission: RE | Admit: 2010-11-17 | Discharge: 2010-11-17 | Disposition: A | Discharge status: Home / Self Care | Payer: Medicare Other | Source: Ambulatory Visit | Attending: Orthopedic Surgery | Admitting: Orthopedic Surgery

## 2010-11-17 ENCOUNTER — Ambulatory Visit (HOSPITAL_BASED_OUTPATIENT_CLINIC_OR_DEPARTMENT_OTHER)
Admission: RE | Admit: 2010-11-17 | Discharge: 2010-11-18 | Disposition: A | Discharge status: Home / Self Care | Payer: Medicare Other | Source: Ambulatory Visit | Attending: Orthopedic Surgery | Admitting: Orthopedic Surgery

## 2010-11-17 ENCOUNTER — Other Ambulatory Visit (HOSPITAL_COMMUNITY): Payer: Self-pay | Admitting: Orthopedic Surgery

## 2010-11-17 DIAGNOSIS — S42209A Unspecified fracture of upper end of unspecified humerus, initial encounter for closed fracture: Secondary | ICD-10-CM | POA: Insufficient documentation

## 2010-11-17 DIAGNOSIS — S42302A Unspecified fracture of shaft of humerus, left arm, initial encounter for closed fracture: Secondary | ICD-10-CM

## 2010-11-17 DIAGNOSIS — Z01818 Encounter for other preprocedural examination: Secondary | ICD-10-CM | POA: Insufficient documentation

## 2010-11-17 DIAGNOSIS — W19XXXA Unspecified fall, initial encounter: Secondary | ICD-10-CM | POA: Insufficient documentation

## 2010-11-17 DIAGNOSIS — Z01812 Encounter for preprocedural laboratory examination: Secondary | ICD-10-CM | POA: Insufficient documentation

## 2010-11-17 LAB — POCT HEMOGLOBIN-HEMACUE: Hemoglobin: 8.9 g/dL — ABNORMAL LOW (ref 13.0–17.0)

## 2010-11-17 LAB — GLUCOSE, CAPILLARY
Glucose-Capillary: 212 mg/dL — ABNORMAL HIGH (ref 70–99)
Glucose-Capillary: 155 mg/dL — ABNORMAL HIGH (ref 70–99)

## 2010-11-18 LAB — CBC
HCT: 25.6 % — ABNORMAL LOW (ref 39.0–52.0)
Hemoglobin: 8.6 g/dL — ABNORMAL LOW (ref 13.0–17.0)
MCH: 28.4 pg (ref 26.0–34.0)
MCHC: 33.6 g/dL (ref 30.0–36.0)
RDW: 17.3 % — ABNORMAL HIGH (ref 11.5–15.5)

## 2010-11-18 LAB — GLUCOSE, CAPILLARY
Glucose-Capillary: 175 mg/dL — ABNORMAL HIGH (ref 70–99)
Glucose-Capillary: 220 mg/dL — ABNORMAL HIGH (ref 70–99)

## 2010-11-24 NOTE — Op Note (Signed)
Jesus [name], STONEHOCKER           ACCOUNT NO.:  [id number]  MEDICAL RECORD NO.:  [id number]  LOCATION:  XRAY                         FACILITY:  [facility]  PHYSICIAN:  Jones Broom, MD    DATE OF BIRTH:  1940/12/04  DATE OF PROCEDURE:  11/17/2010 DATE OF DISCHARGE:                              OPERATIVE REPORT   PREOPERATIVE DIAGNOSIS:  Left shoulder displaced proximal humerus fracture.  POSTOPERATIVE DIAGNOSIS:  Left shoulder displaced proximal humerus fracture.  PROCEDURE PERFORMED:  Open reduction and internal fixation, left displaced proximal humerus fracture.  ATTENDING SURGEON:  Jones Broom, MD  ASSISTANT:  Skip Mayer, PA-C  COMPLICATIONS:  None.  DRAINS:  None.  SPECIMENS:  None.  ESTIMATED BLOOD LOSS:  150 mL.  INDICATIONS FOR SURGERY:  The patient is a 69 year old gentleman who fell 3 days ago suffering the above injury.  He was indicated for operative treatment given the extremely displaced nature of the fracture.  He understood risks, benefits, and alternatives of the procedure including but not limited to risk of bleeding, infection, damage to neurovascular structures, nonhealing, and potential for prominent hardware.  He understood all this and elected to go forward with surgery.  OPERATIVE FINDINGS:  The fracture was fixed in a near anatomic position with a DePuy S3 plate with 6 locking pegs proximally and 3 bicortical screws distally.  PROCEDURE:  The patient was identified in preoperative holding area where I personally marked the operative site after verifying site side and procedure with the patient.  He was taken back to the operating room after an interscalene block was given by the attending anesthesiologist. General anesthesia was induced in the operating room and then he was placed in a beach-chair position with all extremities carefully padded and positioned.  The left upper extremity was prepped and draped in a standard sterile  fashion and the appropriate time-out procedure was carried out.  The patient did receive 600 mg IV clindamycin prior to incision.  A approximately 10-cm incision was made from the tip of the coracoid down to the center point on the humerus at the level of the axilla.  Dissection was carried down through subcutaneous tissues until a cephalic vein was identified.  This was taken laterally with the deltoid.  The deltopectoral interval was opened and the underlying conjoined tendon was found.  The upper 1 cm of the pectoralis major was taken down for exposure.  The biceps tendon was identified and traced into the bicipital groove.  The rotator interval was opened to allow biceps tenotomy at the level of the supraglenoid tubercle.  The biceps was tenodesed to the upper border of the pectoralis major and the remaining portion of biceps was discarded.  The fracture was cleaned of hematoma and the shaft was noted to be displaced 100% medially.  With gentle manipulation with Cobb elevator, the fracture was reduced and he was noted to have some comminution of the posterior greater tuberosity. A FiberWire suture was used at the junction of the bone tendon at the rotator cuff superior posterior which assisted in control of the head proximally.  With some manipulation, a near anatomic reduction was achieved and a plate was laid on laterally.  Guidewire was placed and  fluoroscopic imaging demonstrated appropriate positioning of the plate. It was centered in the AP plane.  The locking pegs were then placed proximally by drilling, measuring, and filling the appropriate size peg which all locked into the plate and distally 3 bicortical nonlocking screws were placed with good fixation.  After the proximal distal segments were moving as a unit, I was able to further internally rotate to gain control of the posterior fragments of the greater tuberosity which were comminuted.  This was done with another #2  FiberWire at the bone tendon junction of the infraspinatus reducing fragments nicely and tying into the plate.  A third #2 FiberWire was used at the bone tendon junction of the subscapularis, tied into the plate as well as the one that was placed superiorly initially, the strength and the construct. Final fluoroscopic imaging in AP, lateral, and round-the-world imaging demonstrated appropriate positioning of the plate with near anatomic reduction of the fracture.  No apparent penetration of the pegs was noted.  The wound was then copiously irrigated with normal saline and subsequently closed in layers with a 2-0 Vicryl in deep dermal layer and staples for skin closure.  No drain was applied.  Wound was dry at the time of closure.  Sterile dressings were then applied including Adaptic, 4x4s, ABDs, and tape.  The patient was then placed in a sling, allowed to awaken from general anesthesia, transferred to stretcher and taken to the recovery room in stable condition.  POSTOPERATIVE PLAN:  He will be kept overnight for pain control and antibiotics.  He will be discharged in the morning with his family assuming he is stable.  He will remain in a sling until followup in 10- 14 days.     Jones Broom, MD     JC/MEDQ  D:  11/17/2010  T:  11/17/2010  Job:  [id number]  Electronically Signed by Jones Broom  on 11/24/2010 02:02:17 PM

## 2010-12-01 ENCOUNTER — Encounter (HOSPITAL_BASED_OUTPATIENT_CLINIC_OR_DEPARTMENT_OTHER): Payer: Medicare Other | Attending: General Surgery

## 2010-12-01 DIAGNOSIS — Z79899 Other long term (current) drug therapy: Secondary | ICD-10-CM | POA: Insufficient documentation

## 2010-12-01 DIAGNOSIS — S71109A Unspecified open wound, unspecified thigh, initial encounter: Secondary | ICD-10-CM | POA: Insufficient documentation

## 2010-12-01 DIAGNOSIS — X58XXXA Exposure to other specified factors, initial encounter: Secondary | ICD-10-CM | POA: Insufficient documentation

## 2010-12-01 DIAGNOSIS — E119 Type 2 diabetes mellitus without complications: Secondary | ICD-10-CM | POA: Insufficient documentation

## 2010-12-01 DIAGNOSIS — D509 Iron deficiency anemia, unspecified: Secondary | ICD-10-CM | POA: Insufficient documentation

## 2010-12-01 DIAGNOSIS — S71009A Unspecified open wound, unspecified hip, initial encounter: Secondary | ICD-10-CM | POA: Insufficient documentation

## 2010-12-01 DIAGNOSIS — E7800 Pure hypercholesterolemia, unspecified: Secondary | ICD-10-CM | POA: Insufficient documentation

## 2010-12-01 DIAGNOSIS — N400 Enlarged prostate without lower urinary tract symptoms: Secondary | ICD-10-CM | POA: Insufficient documentation

## 2010-12-01 DIAGNOSIS — I10 Essential (primary) hypertension: Secondary | ICD-10-CM | POA: Insufficient documentation

## 2010-12-22 NOTE — H&P (Signed)
  Jesus [name], Jesus [name]           ACCOUNT NO.:  [id number]  MEDICAL RECORD NO.:  [id number]  LOCATION:  FOOT                         FACILITY:  [facility]  PHYSICIAN:  Joanne Gavel, M.D.        DATE OF BIRTH:  Mar 08, 1940  DATE OF ADMISSION:  09/22/2010 DATE OF DISCHARGE:                             HISTORY & PHYSICAL   CHIEF COMPLAINT:  Wounds, left thigh.  HISTORY OF PRESENT ILLNESS:  The 70 year old male diabetic for many years who developed a MRSA infection in his right groin several years ago.  This is treated with antibiotics.  He developed an abscess of his left thigh several weeks ago.  This was drained, cultured, started on antibiotics.  He was placed on more antibiotics, first Septra and now clindamycin.  He has several small wounds of his thigh which have not healed.  PAST MEDICAL HISTORY:  He has had coronary stents and angioplasties, has history of kidney stone, hypertension, hypocholesterolemia, benign prostatic hypertrophy and iron deficiency anemia.  PAST SURGICAL HISTORY:  Appendectomy, right leg fracture with femoral nail, hemorrhoidectomy and angioplasty  ALLERGIES:  CAMPHOR and MENTHOL.  MEDICATIONS:  Gluconate, Actos, Januvia, tamsulosin, multivitamins and iron, Niaspan, atorvastatin, VESIcare, Benicar, clindamycin and Humalog.  Cigarettes and alcohol none.  PHYSICAL EXAMINATION:  VITAL SIGNS:  Temperature 98.7, pulse 100, respirations 20, blood pressure 152/74, glucose 138. HEAD, EYES, EARS, THROAT:  Normal. CHEST:  Clear. HEART:  Regular rhythm. EXTREMITIES:  Examination of the left thigh reveals 4 small wounds after debridement, it is clear that these wounds will communicate.  Culture was taken with normal skin bridge.  The entire cavity is approximately 10 x 10 cm.  PLAN OF TREATMENT: 1. First referred a general surgery for proper unroofing of this     abscess cavity. 2. Refer to Infectious Disease for possible treatment of carrier     state. 3.  Pack wound with silver alginate at present. 4. We will see the patient in 3 days for a nurse check if he does not     see the general surgeon before that and in 7 days.     Joanne Gavel, M.D.     RA/MEDQ  D:  09/22/2010  T:  09/22/2010  Job:  [id number]  cc:   Ralene Ok, M.D.  Electronically Signed by Joanne Gavel M.D. on 12/22/2010 09:10:19 AM

## 2011-01-05 ENCOUNTER — Encounter (HOSPITAL_BASED_OUTPATIENT_CLINIC_OR_DEPARTMENT_OTHER): Payer: Medicare Other | Attending: General Surgery

## 2011-01-05 DIAGNOSIS — E7800 Pure hypercholesterolemia, unspecified: Secondary | ICD-10-CM | POA: Insufficient documentation

## 2011-01-05 DIAGNOSIS — D509 Iron deficiency anemia, unspecified: Secondary | ICD-10-CM | POA: Insufficient documentation

## 2011-01-05 DIAGNOSIS — S71009A Unspecified open wound, unspecified hip, initial encounter: Secondary | ICD-10-CM | POA: Insufficient documentation

## 2011-01-05 DIAGNOSIS — Z79899 Other long term (current) drug therapy: Secondary | ICD-10-CM | POA: Insufficient documentation

## 2011-01-05 DIAGNOSIS — S71109A Unspecified open wound, unspecified thigh, initial encounter: Secondary | ICD-10-CM | POA: Insufficient documentation

## 2011-01-05 DIAGNOSIS — Z8614 Personal history of Methicillin resistant Staphylococcus aureus infection: Secondary | ICD-10-CM | POA: Insufficient documentation

## 2011-01-05 DIAGNOSIS — Y838 Other surgical procedures as the cause of abnormal reaction of the patient, or of later complication, without mention of misadventure at the time of the procedure: Secondary | ICD-10-CM | POA: Insufficient documentation

## 2011-01-05 DIAGNOSIS — I10 Essential (primary) hypertension: Secondary | ICD-10-CM | POA: Insufficient documentation

## 2011-01-05 DIAGNOSIS — N400 Enlarged prostate without lower urinary tract symptoms: Secondary | ICD-10-CM | POA: Insufficient documentation

## 2011-02-02 ENCOUNTER — Encounter (HOSPITAL_BASED_OUTPATIENT_CLINIC_OR_DEPARTMENT_OTHER): Payer: Medicare Other

## 2011-03-11 ENCOUNTER — Ambulatory Visit (INDEPENDENT_AMBULATORY_CARE_PROVIDER_SITE_OTHER): Payer: Medicare Other | Admitting: Cardiology

## 2011-03-11 ENCOUNTER — Encounter: Payer: Self-pay | Admitting: Cardiology

## 2011-03-11 VITALS — BP 132/72 | HR 76 | Ht 68.0 in | Wt 211.2 lb

## 2011-03-11 DIAGNOSIS — E785 Hyperlipidemia, unspecified: Secondary | ICD-10-CM

## 2011-03-11 DIAGNOSIS — I2510 Atherosclerotic heart disease of native coronary artery without angina pectoris: Secondary | ICD-10-CM

## 2011-03-11 DIAGNOSIS — I10 Essential (primary) hypertension: Secondary | ICD-10-CM

## 2011-03-11 DIAGNOSIS — I452 Bifascicular block: Secondary | ICD-10-CM

## 2011-03-11 DIAGNOSIS — E119 Type 2 diabetes mellitus without complications: Secondary | ICD-10-CM

## 2011-03-11 MED ORDER — NITROGLYCERIN 0.4 MG SL SUBL: 0.4000 mg | SUBLINGUAL_TABLET | SUBLINGUAL | Status: DC | PRN

## 2011-03-11 NOTE — Patient Instructions (Signed)
Continue your current medications.  I will see you again in about 6 months.

## 2011-03-11 NOTE — Assessment & Plan Note (Signed)
He remains asymptomatic. His last stress Myoview study in July of 2011 was normal. Continue with his medical therapy and risk factor modification.
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
## 2011-03-11 NOTE — Assessment & Plan Note (Signed)
This is chronic and unchanged

## 2011-03-11 NOTE — Assessment & Plan Note (Signed)
Blood pressure is well controlled on current medications. 

## 2011-03-26 ENCOUNTER — Ambulatory Visit
Admission: RE | Admit: 2011-03-26 | Discharge: 2011-03-26 | Disposition: A | Discharge status: Home / Self Care | Payer: Medicare Other | Source: Ambulatory Visit | Attending: Orthopedic Surgery | Admitting: Orthopedic Surgery

## 2011-03-26 ENCOUNTER — Other Ambulatory Visit: Payer: Self-pay | Admitting: Orthopedic Surgery

## 2011-03-26 DIAGNOSIS — S4290XA Fracture of unspecified shoulder girdle, part unspecified, initial encounter for closed fracture: Secondary | ICD-10-CM

## 2011-04-02 ENCOUNTER — Other Ambulatory Visit: Payer: Self-pay | Admitting: Orthopedic Surgery

## 2011-04-02 ENCOUNTER — Encounter (HOSPITAL_COMMUNITY): Payer: Self-pay | Admitting: Pharmacy Technician

## 2011-04-09 ENCOUNTER — Encounter (HOSPITAL_COMMUNITY)
Admission: RE | Admit: 2011-04-09 | Discharge: 2011-04-09 | Disposition: A | Discharge status: Home / Self Care | Payer: Medicare Other | Source: Ambulatory Visit | Attending: Orthopedic Surgery | Admitting: Orthopedic Surgery

## 2011-04-09 ENCOUNTER — Encounter (HOSPITAL_COMMUNITY): Payer: Self-pay

## 2011-04-09 HISTORY — DX: Encounter for other specified aftercare: Z51.89

## 2011-04-09 HISTORY — DX: Gastro-esophageal reflux disease without esophagitis: K21.9

## 2011-04-09 HISTORY — DX: Reserved for inherently not codable concepts without codable children: IMO0001

## 2011-04-09 LAB — APTT: aPTT: 30 seconds (ref 24–37)

## 2011-04-09 LAB — URINALYSIS, ROUTINE W REFLEX MICROSCOPIC
Glucose, UA: NEGATIVE mg/dL
Hgb urine dipstick: NEGATIVE
Protein, ur: NEGATIVE mg/dL
Specific Gravity, Urine: 1.021 (ref 1.005–1.030)

## 2011-04-09 LAB — COMPREHENSIVE METABOLIC PANEL
ALT: 9 U/L (ref 0–53)
AST: 13 U/L (ref 0–37)
CO2: 26 mEq/L (ref 19–32)
Calcium: 9.8 mg/dL (ref 8.4–10.5)
Chloride: 103 mEq/L (ref 96–112)
GFR calc non Af Amer: 53 mL/min — ABNORMAL LOW (ref >90)
Potassium: 3.8 mEq/L (ref 3.5–5.1)
Sodium: 140 mEq/L (ref 135–145)

## 2011-04-09 LAB — TYPE AND SCREEN
ABO/RH(D): O NEG
Antibody Screen: NEGATIVE

## 2011-04-09 LAB — SURGICAL PCR SCREEN
MRSA, PCR: NEGATIVE
Staphylococcus aureus: NEGATIVE

## 2011-04-09 LAB — DIFFERENTIAL
Basophils Absolute: 0 10*3/uL (ref 0.0–0.1)
Eosinophils Relative: 2 % (ref 0–5)
Lymphocytes Relative: 23 % (ref 12–46)
Monocytes Absolute: 0.8 10*3/uL (ref 0.1–1.0)

## 2011-04-09 LAB — CBC
MCH: 26.8 pg (ref 26.0–34.0)
Platelets: 144 10*3/uL — ABNORMAL LOW (ref 150–400)
RBC: 5.22 MIL/uL (ref 4.22–5.81)
WBC: 6.9 10*3/uL (ref 4.0–10.5)

## 2011-04-09 NOTE — Progress Notes (Signed)
Note from dr [place] 1.10.13 ,ekg 1.10.13 stress test 7.11 nl in epic

## 2011-04-09 NOTE — Pre-Procedure Instructions (Signed)
20 Jesus [name]  04/09/2011   Your procedure is scheduled on:  2.12.13  Report to [facility] at 820 AM.  Call this number if you have problems the morning of surgery: [phone]   Remember:   Do not eat food:After Midnight.  May have clear liquids: up to 4 Hours before arrival.  Clear liquids include soda, tea, black coffee, apple or grape juice, broth.  Take these medicines the morning of surgery with A SIP OF WATER: metoprolol,micardis,vesicare hydrocodone STOP aspirin   Do not wear jewelry, make-up or nail polish.  Do not wear lotions, powders, or perfumes. You may wear deodorant.  Do not shave 48 hours prior to surgery.  Do not bring valuables to the hospital.  Contacts, dentures or bridgework may not be worn into surgery.  Leave suitcase in the car. After surgery it may be brought to your room.  For patients admitted to the hospital, checkout time is 11:00 AM the day of discharge.   Patients discharged the day of surgery will not be allowed to drive home.  Name and phone number of your driver: Bonita Quin [phone], wife courtney fox dtr [phone]  Special Instructions: Incentive Spirometry - Practice and bring it with you on the day of surgery. and CHG Shower Use Special Wash: 1/2 bottle night before surgery and 1/2 bottle morning of surgery.   Please read over the following fact sheets that you were given: Pain Booklet, Coughing and Deep Breathing, Blood Transfusion Information, MRSA Information and Surgical Site Infection Prevention

## 2011-04-12 MED ORDER — POVIDONE-IODINE 7.5 % EX SOLN
Freq: Once | CUTANEOUS | Status: DC
  Filled 2011-04-12: qty 118

## 2011-04-12 MED ORDER — CEFAZOLIN SODIUM-DEXTROSE 2-3 GM-% IV SOLR
2.0000 g | INTRAVENOUS | Status: AC
  Administered 2011-04-13: 2 g via INTRAVENOUS
  Filled 2011-04-12: qty 50

## 2011-04-13 ENCOUNTER — Ambulatory Visit (HOSPITAL_COMMUNITY): Payer: Medicare Other | Admitting: Critical Care Medicine

## 2011-04-13 ENCOUNTER — Encounter (HOSPITAL_COMMUNITY): Payer: Self-pay | Admitting: Critical Care Medicine

## 2011-04-13 ENCOUNTER — Ambulatory Visit (HOSPITAL_COMMUNITY): Payer: Medicare Other

## 2011-04-13 ENCOUNTER — Encounter (HOSPITAL_COMMUNITY): Payer: Self-pay | Admitting: *Deleted

## 2011-04-13 ENCOUNTER — Encounter (HOSPITAL_COMMUNITY)
Admission: RE | Disposition: A | Discharge status: Home / Self Care | Payer: Self-pay | Source: Ambulatory Visit | Attending: Orthopedic Surgery

## 2011-04-13 ENCOUNTER — Inpatient Hospital Stay (HOSPITAL_COMMUNITY)
Admission: RE | Admit: 2011-04-13 | Discharge: 2011-04-14 | DRG: 484 | Disposition: A | Discharge status: Home / Self Care | Payer: Medicare Other | Source: Ambulatory Visit | Attending: Orthopedic Surgery | Admitting: Orthopedic Surgery

## 2011-04-13 DIAGNOSIS — Z87891 Personal history of nicotine dependence: Secondary | ICD-10-CM

## 2011-04-13 DIAGNOSIS — E785 Hyperlipidemia, unspecified: Secondary | ICD-10-CM | POA: Diagnosis present

## 2011-04-13 DIAGNOSIS — Z01812 Encounter for preprocedural laboratory examination: Secondary | ICD-10-CM

## 2011-04-13 DIAGNOSIS — T8489XA Other specified complication of internal orthopedic prosthetic devices, implants and grafts, initial encounter: Principal | ICD-10-CM | POA: Diagnosis present

## 2011-04-13 DIAGNOSIS — I10 Essential (primary) hypertension: Secondary | ICD-10-CM | POA: Diagnosis present

## 2011-04-13 DIAGNOSIS — Z9861 Coronary angioplasty status: Secondary | ICD-10-CM

## 2011-04-13 DIAGNOSIS — M25519 Pain in unspecified shoulder: Secondary | ICD-10-CM | POA: Diagnosis present

## 2011-04-13 DIAGNOSIS — I2510 Atherosclerotic heart disease of native coronary artery without angina pectoris: Secondary | ICD-10-CM | POA: Diagnosis present

## 2011-04-13 DIAGNOSIS — D649 Anemia, unspecified: Secondary | ICD-10-CM | POA: Diagnosis present

## 2011-04-13 DIAGNOSIS — N400 Enlarged prostate without lower urinary tract symptoms: Secondary | ICD-10-CM | POA: Diagnosis present

## 2011-04-13 DIAGNOSIS — Y831 Surgical operation with implant of artificial internal device as the cause of abnormal reaction of the patient, or of later complication, without mention of misadventure at the time of the procedure: Secondary | ICD-10-CM | POA: Diagnosis present

## 2011-04-13 DIAGNOSIS — E119 Type 2 diabetes mellitus without complications: Secondary | ICD-10-CM | POA: Diagnosis present

## 2011-04-13 DIAGNOSIS — K219 Gastro-esophageal reflux disease without esophagitis: Secondary | ICD-10-CM | POA: Diagnosis present

## 2011-04-13 DIAGNOSIS — I4510 Unspecified right bundle-branch block: Secondary | ICD-10-CM | POA: Diagnosis present

## 2011-04-13 HISTORY — PX: SHOULDER HEMI-ARTHROPLASTY: SHX5049

## 2011-04-13 HISTORY — PX: HARDWARE REMOVAL: SHX979

## 2011-04-13 LAB — GRAM STAIN

## 2011-04-13 LAB — GLUCOSE, CAPILLARY
Glucose-Capillary: 74 mg/dL (ref 70–99)
Glucose-Capillary: 82 mg/dL (ref 70–99)
Glucose-Capillary: 179 mg/dL — ABNORMAL HIGH (ref 70–99)

## 2011-04-13 SURGERY — REMOVAL, HARDWARE
Anesthesia: General | Site: Shoulder | Laterality: Left | Wound class: Clean

## 2011-04-13 MED ORDER — METHOCARBAMOL 100 MG/ML IJ SOLN
500.0000 mg | Freq: Four times a day (QID) | INTRAVENOUS | Status: DC | PRN
  Filled 2011-04-13: qty 5

## 2011-04-13 MED ORDER — METOCLOPRAMIDE HCL 5 MG/ML IJ SOLN
5.0000 mg | Freq: Three times a day (TID) | INTRAMUSCULAR | Status: DC | PRN
  Filled 2011-04-13: qty 2

## 2011-04-13 MED ORDER — FENTANYL CITRATE 0.05 MG/ML IJ SOLN: 100.0000 ug | INTRAMUSCULAR | Status: DC | PRN

## 2011-04-13 MED ORDER — FERROUS SULFATE 325 (65 FE) MG PO TABS
325.0000 mg | ORAL_TABLET | Freq: Every day | ORAL | Status: DC
  Administered 2011-04-14: 325 mg via ORAL
  Filled 2011-04-13 (×2): qty 1

## 2011-04-13 MED ORDER — CALCIUM CARBONATE-VITAMIN D 500-200 MG-UNIT PO TABS
0.5000 | ORAL_TABLET | Freq: Every day | ORAL | Status: DC
  Administered 2011-04-14: 1 via ORAL
  Filled 2011-04-13: qty 0.5

## 2011-04-13 MED ORDER — MULTIVITAMINS PO TABS: 1.0000 | ORAL_TABLET | Freq: Every day | ORAL | Status: DC

## 2011-04-13 MED ORDER — MORPHINE SULFATE 2 MG/ML IJ SOLN: 1.0000 mg | INTRAMUSCULAR | Status: DC | PRN

## 2011-04-13 MED ORDER — ONDANSETRON HCL 4 MG PO TABS: 4.0000 mg | ORAL_TABLET | Freq: Four times a day (QID) | ORAL | Status: DC | PRN

## 2011-04-13 MED ORDER — HETASTARCH-ELECTROLYTES 6 % IV SOLN
INTRAVENOUS | Status: DC | PRN
  Administered 2011-04-13: 13:00:00 via INTRAVENOUS

## 2011-04-13 MED ORDER — CALCIUM CITRATE-VITAMIN D 250-100 MG-UNIT PO TABS: 1.0000 | ORAL_TABLET | Freq: Once | ORAL | Status: DC

## 2011-04-13 MED ORDER — OLMESARTAN MEDOXOMIL 20 MG PO TABS
20.0000 mg | ORAL_TABLET | Freq: Every day | ORAL | Status: DC
  Administered 2011-04-14: 20 mg via ORAL
  Filled 2011-04-13 (×2): qty 1

## 2011-04-13 MED ORDER — HYDROCODONE-ACETAMINOPHEN 5-325 MG PO TABS: 1.0000 | ORAL_TABLET | Freq: Four times a day (QID) | ORAL | Status: DC | PRN

## 2011-04-13 MED ORDER — ADULT MULTIVITAMIN W/MINERALS CH
1.0000 | ORAL_TABLET | Freq: Every day | ORAL | Status: DC
  Administered 2011-04-13 – 2011-04-14 (×2): 1 via ORAL
  Filled 2011-04-13 (×2): qty 1

## 2011-04-13 MED ORDER — GLYBURIDE-METFORMIN 5-500 MG PO TABS: 2.0000 | ORAL_TABLET | Freq: Two times a day (BID) | ORAL | Status: DC

## 2011-04-13 MED ORDER — FENTANYL CITRATE 0.05 MG/ML IJ SOLN
INTRAMUSCULAR | Status: DC | PRN
  Administered 2011-04-13 (×3): 50 ug via INTRAVENOUS

## 2011-04-13 MED ORDER — OXYCODONE-ACETAMINOPHEN 5-325 MG PO TABS
1.0000 | ORAL_TABLET | ORAL | Status: DC | PRN
  Administered 2011-04-13 (×2): 1 via ORAL
  Administered 2011-04-14 (×2): 2 via ORAL
  Filled 2011-04-13: qty 2
  Filled 2011-04-13 (×2): qty 1
  Filled 2011-04-13: qty 2

## 2011-04-13 MED ORDER — HYDROMORPHONE HCL PF 1 MG/ML IJ SOLN: 0.2500 mg | INTRAMUSCULAR | Status: DC | PRN

## 2011-04-13 MED ORDER — SIMVASTATIN 40 MG PO TABS
40.0000 mg | ORAL_TABLET | Freq: Every day | ORAL | Status: DC
  Administered 2011-04-13: 40 mg via ORAL
  Filled 2011-04-13 (×2): qty 1

## 2011-04-13 MED ORDER — INSULIN ASPART 100 UNIT/ML ~~LOC~~ SOLN
0.0000 [IU] | Freq: Three times a day (TID) | SUBCUTANEOUS | Status: DC
  Administered 2011-04-14: 3 [IU] via SUBCUTANEOUS
  Administered 2011-04-14: 5 [IU] via SUBCUTANEOUS
  Filled 2011-04-13: qty 3

## 2011-04-13 MED ORDER — METFORMIN HCL 500 MG PO TABS
500.0000 mg | ORAL_TABLET | Freq: Two times a day (BID) | ORAL | Status: DC
  Administered 2011-04-13 – 2011-04-14 (×2): 500 mg via ORAL
  Filled 2011-04-13 (×4): qty 1

## 2011-04-13 MED ORDER — IRON 325 (65 FE) MG PO TABS: 325.0000 mg | ORAL_TABLET | Freq: Every day | ORAL | Status: DC

## 2011-04-13 MED ORDER — PIOGLITAZONE HCL 45 MG PO TABS
22.5000 mg | ORAL_TABLET | Freq: Every day | ORAL | Status: DC
  Administered 2011-04-14: 22.5 mg via ORAL
  Filled 2011-04-13 (×2): qty 0.5

## 2011-04-13 MED ORDER — PHENYLEPHRINE HCL 10 MG/ML IJ SOLN
INTRAMUSCULAR | Status: DC | PRN
  Administered 2011-04-13: 40 ug via INTRAVENOUS

## 2011-04-13 MED ORDER — MIDAZOLAM HCL 2 MG/2ML IJ SOLN: 2.0000 mg | INTRAMUSCULAR | Status: DC | PRN

## 2011-04-13 MED ORDER — LINAGLIPTIN 5 MG PO TABS
5.0000 mg | ORAL_TABLET | Freq: Every day | ORAL | Status: DC
  Administered 2011-04-14: 5 mg via ORAL
  Filled 2011-04-13 (×2): qty 1

## 2011-04-13 MED ORDER — DARIFENACIN HYDROBROMIDE ER 7.5 MG PO TB24
7.5000 mg | ORAL_TABLET | Freq: Every day | ORAL | Status: DC
  Administered 2011-04-13 – 2011-04-14 (×2): 7.5 mg via ORAL
  Filled 2011-04-13 (×2): qty 1

## 2011-04-13 MED ORDER — ASPIRIN 81 MG PO CHEW
81.0000 mg | CHEWABLE_TABLET | Freq: Every day | ORAL | Status: DC
  Administered 2011-04-13 – 2011-04-14 (×2): 81 mg via ORAL
  Filled 2011-04-13 (×2): qty 1

## 2011-04-13 MED ORDER — ROCURONIUM BROMIDE 100 MG/10ML IV SOLN
INTRAVENOUS | Status: DC | PRN
  Administered 2011-04-13: 50 mg via INTRAVENOUS

## 2011-04-13 MED ORDER — PROPOFOL 10 MG/ML IV EMUL
INTRAVENOUS | Status: DC | PRN
  Administered 2011-04-13: 160 mg via INTRAVENOUS

## 2011-04-13 MED ORDER — GLYBURIDE 5 MG PO TABS
5.0000 mg | ORAL_TABLET | Freq: Two times a day (BID) | ORAL | Status: DC
  Administered 2011-04-13 – 2011-04-14 (×2): 5 mg via ORAL
  Filled 2011-04-13 (×4): qty 1

## 2011-04-13 MED ORDER — METOPROLOL TARTRATE 50 MG PO TABS
50.0000 mg | ORAL_TABLET | Freq: Two times a day (BID) | ORAL | Status: DC
  Administered 2011-04-13 – 2011-04-14 (×2): 50 mg via ORAL
  Filled 2011-04-13 (×3): qty 1

## 2011-04-13 MED ORDER — PHENYLEPHRINE HCL 10 MG/ML IJ SOLN
10.0000 mg | INTRAVENOUS | Status: DC | PRN
  Administered 2011-04-13: 20 ug/min via INTRAVENOUS

## 2011-04-13 MED ORDER — ACETAMINOPHEN 325 MG PO TABS: 650.0000 mg | ORAL_TABLET | Freq: Four times a day (QID) | ORAL | Status: DC | PRN

## 2011-04-13 MED ORDER — INSULIN ASPART 100 UNIT/ML ~~LOC~~ SOLN
SUBCUTANEOUS | Status: DC | PRN
  Administered 2011-04-13: 6 [IU] via SUBCUTANEOUS

## 2011-04-13 MED ORDER — CEFAZOLIN SODIUM 1-5 GM-% IV SOLN
1.0000 g | Freq: Four times a day (QID) | INTRAVENOUS | Status: AC
  Administered 2011-04-13 – 2011-04-14 (×3): 1 g via INTRAVENOUS
  Filled 2011-04-13 (×3): qty 50

## 2011-04-13 MED ORDER — ONDANSETRON HCL 4 MG/2ML IJ SOLN
INTRAMUSCULAR | Status: DC | PRN
  Administered 2011-04-13 (×2): 4 mg via INTRAVENOUS

## 2011-04-13 MED ORDER — ASPIRIN 81 MG PO TABS: 81.0000 mg | ORAL_TABLET | Freq: Every day | ORAL | Status: DC

## 2011-04-13 MED ORDER — NITROGLYCERIN 0.4 MG SL SUBL: 0.4000 mg | SUBLINGUAL_TABLET | SUBLINGUAL | Status: DC | PRN

## 2011-04-13 MED ORDER — DOCUSATE SODIUM 100 MG PO CAPS
100.0000 mg | ORAL_CAPSULE | Freq: Two times a day (BID) | ORAL | Status: DC
  Administered 2011-04-13 – 2011-04-14 (×2): 100 mg via ORAL
  Filled 2011-04-13 (×3): qty 1

## 2011-04-13 MED ORDER — METHOCARBAMOL 500 MG PO TABS
500.0000 mg | ORAL_TABLET | Freq: Four times a day (QID) | ORAL | Status: DC | PRN
  Administered 2011-04-13 – 2011-04-14 (×2): 500 mg via ORAL
  Filled 2011-04-13 (×2): qty 1

## 2011-04-13 MED ORDER — MEPERIDINE HCL 25 MG/ML IJ SOLN: 6.2500 mg | INTRAMUSCULAR | Status: DC | PRN

## 2011-04-13 MED ORDER — SODIUM CHLORIDE 0.9 % IR SOLN
Status: DC | PRN
  Administered 2011-04-13: 1000 mL

## 2011-04-13 MED ORDER — ROPIVACAINE HCL 5 MG/ML IJ SOLN
INTRAMUSCULAR | Status: DC | PRN
  Administered 2011-04-13: 30 mL via EPIDURAL

## 2011-04-13 MED ORDER — HEPARIN SODIUM (PORCINE) 1000 UNIT/ML IJ SOLN
INTRAMUSCULAR | Status: DC | PRN
  Administered 2011-04-13: 6000 [IU] via INTRAVENOUS

## 2011-04-13 MED ORDER — NIACIN ER (ANTIHYPERLIPIDEMIC) 500 MG PO TBCR
500.0000 mg | EXTENDED_RELEASE_TABLET | Freq: Every day | ORAL | Status: DC
  Administered 2011-04-13: 500 mg via ORAL
  Filled 2011-04-13 (×2): qty 1

## 2011-04-13 MED ORDER — PROMETHAZINE HCL 25 MG/ML IJ SOLN: 6.2500 mg | INTRAMUSCULAR | Status: DC | PRN

## 2011-04-13 MED ORDER — METOCLOPRAMIDE HCL 10 MG PO TABS: 5.0000 mg | ORAL_TABLET | Freq: Three times a day (TID) | ORAL | Status: DC | PRN

## 2011-04-13 MED ORDER — ACETAMINOPHEN 10 MG/ML IV SOLN
INTRAVENOUS | Status: DC | PRN
  Administered 2011-04-13: 1000 mg via INTRAVENOUS

## 2011-04-13 MED ORDER — GLYCOPYRROLATE 0.2 MG/ML IJ SOLN
INTRAMUSCULAR | Status: DC | PRN
  Administered 2011-04-13: .4 mg via INTRAVENOUS

## 2011-04-13 MED ORDER — ACETAMINOPHEN 650 MG RE SUPP: 650.0000 mg | Freq: Four times a day (QID) | RECTAL | Status: DC | PRN

## 2011-04-13 MED ORDER — ONDANSETRON HCL 4 MG/2ML IJ SOLN: 4.0000 mg | Freq: Four times a day (QID) | INTRAMUSCULAR | Status: DC | PRN

## 2011-04-13 MED ORDER — TAMSULOSIN HCL 0.4 MG PO CAPS
0.4000 mg | ORAL_CAPSULE | Freq: Every day | ORAL | Status: DC
  Administered 2011-04-13 – 2011-04-14 (×2): 0.4 mg via ORAL
  Filled 2011-04-13 (×2): qty 1

## 2011-04-13 MED ORDER — NEOSTIGMINE METHYLSULFATE 1 MG/ML IJ SOLN
INTRAMUSCULAR | Status: DC | PRN
  Administered 2011-04-13: 3 mg via INTRAVENOUS

## 2011-04-13 MED ORDER — MIDAZOLAM HCL 5 MG/5ML IJ SOLN
INTRAMUSCULAR | Status: DC | PRN
  Administered 2011-04-13: 2 mg via INTRAVENOUS

## 2011-04-13 MED ORDER — LACTATED RINGERS IV SOLN
INTRAVENOUS | Status: DC
  Administered 2011-04-13 (×2): via INTRAVENOUS

## 2011-04-13 MED ORDER — DIPHENHYDRAMINE HCL 12.5 MG/5ML PO ELIX
12.5000 mg | ORAL_SOLUTION | ORAL | Status: DC | PRN
  Filled 2011-04-13: qty 10

## 2011-04-13 SURGICAL SUPPLY — 83 items
BANDAGE ELASTIC 6 VELCRO ST LF (GAUZE/BANDAGES/DRESSINGS) IMPLANT
BANDAGE ESMARK 6X9 LF (GAUZE/BANDAGES/DRESSINGS) ×2 IMPLANT
BLADE SAW SAG 73X25 THK (BLADE) ×1
BLADE SAW SGTL 73X25 THK (BLADE) ×2 IMPLANT
BLADE SURG 10 STRL SS (BLADE) ×3 IMPLANT
BLADE SURG ROTATE 9660 (MISCELLANEOUS) IMPLANT
BNDG CMPR 9X6 STRL LF SNTH (GAUZE/BANDAGES/DRESSINGS) ×2
BNDG ESMARK 6X9 LF (GAUZE/BANDAGES/DRESSINGS) ×3
BOWL SMART MIX CTS (DISPOSABLE) IMPLANT
CEMENT BONE DEPUY (Cement) ×4 IMPLANT
CHLORAPREP W/TINT 26ML (MISCELLANEOUS) ×3 IMPLANT
CLOTH BEACON ORANGE TIMEOUT ST (SAFETY) ×3 IMPLANT
COVER MAYO STAND STRL (DRAPES) ×3 IMPLANT
COVER SURGICAL LIGHT HANDLE (MISCELLANEOUS) ×3 IMPLANT
CUFF TOURNIQUET SINGLE 34IN LL (TOURNIQUET CUFF) IMPLANT
CUFF TOURNIQUET SINGLE 44IN (TOURNIQUET CUFF) IMPLANT
DRAPE INCISE IOBAN 66X45 STRL (DRAPES) ×3 IMPLANT
DRAPE OEC MINIVIEW 54X84 (DRAPES) IMPLANT
DRAPE SURG 17X23 STRL (DRAPES) ×3 IMPLANT
DRAPE U-SHAPE 47X51 STRL (DRAPES) ×3 IMPLANT
DRSG ADAPTIC 3X8 NADH LF (GAUZE/BANDAGES/DRESSINGS) ×3 IMPLANT
DRSG PAD ABDOMINAL 8X10 ST (GAUZE/BANDAGES/DRESSINGS) ×4 IMPLANT
ELECT REM PT RETURN 9FT ADLT (ELECTROSURGICAL) ×3
ELECTRODE REM PT RTRN 9FT ADLT (ELECTROSURGICAL) ×2 IMPLANT
EVACUATOR 1/8 PVC DRAIN (DRAIN) ×3 IMPLANT
GAUZE SPONGE 4X4 12PLY STRL LF (GAUZE/BANDAGES/DRESSINGS) ×2 IMPLANT
GAUZE XEROFORM 1X8 LF (GAUZE/BANDAGES/DRESSINGS) ×2 IMPLANT
GLOVE BIO SURGEON STRL SZ7.5 (GLOVE) ×3 IMPLANT
GLOVE BIOGEL PI IND STRL 8 (GLOVE) ×2 IMPLANT
GLOVE BIOGEL PI INDICATOR 8 (GLOVE) ×1
GOWN PREVENTION PLUS XLARGE (GOWN DISPOSABLE) ×3 IMPLANT
GOWN STRL NON-REIN LRG LVL3 (GOWN DISPOSABLE) ×6 IMPLANT
Global unite epiphysis body porocoat ×2 IMPLANT
HANDPIECE INTERPULSE COAX TIP (DISPOSABLE) ×3
HEMOSTAT SURGICEL 2X14 (HEMOSTASIS) IMPLANT
HOOD PEEL AWAY FACE SHEILD DIS (HOOD) ×6 IMPLANT
KIT BASIN OR (CUSTOM PROCEDURE TRAY) ×3 IMPLANT
KIT ROOM TURNOVER OR (KITS) ×3 IMPLANT
MANIFOLD NEPTUNE II (INSTRUMENTS) ×3 IMPLANT
NDL HYPO 25GX1X1/2 BEV (NEEDLE) ×1 IMPLANT
NDL MAYO TROCAR (NEEDLE) ×1 IMPLANT
NEEDLE HYPO 25GX1X1/2 BEV (NEEDLE) ×3 IMPLANT
NEEDLE MAYO TROCAR (NEEDLE) ×3 IMPLANT
NOZZLE PRISM 8.5MM (MISCELLANEOUS) ×3 IMPLANT
NS IRRIG 1000ML POUR BTL (IV SOLUTION) ×3 IMPLANT
PACK ORTHO EXTREMITY (CUSTOM PROCEDURE TRAY) ×3 IMPLANT
PACK SHOULDER (CUSTOM PROCEDURE TRAY) ×3 IMPLANT
PAD ARMBOARD 7.5X6 YLW CONV (MISCELLANEOUS) ×6 IMPLANT
PAD CAST 4YDX4 CTTN HI CHSV (CAST SUPPLIES) IMPLANT
PADDING CAST COTTON 4X4 STRL (CAST SUPPLIES)
RETRIEVER SUT HEWSON (MISCELLANEOUS) IMPLANT
SET HNDPC FAN SPRY TIP SCT (DISPOSABLE) ×2 IMPLANT
SLING ARM IMMOBILIZER LRG (SOFTGOODS) ×3 IMPLANT
SLING ARM IMMOBILIZER MED (SOFTGOODS) IMPLANT
SPONGE GAUZE 4X4 12PLY (GAUZE/BANDAGES/DRESSINGS) ×3 IMPLANT
SPONGE LAP 18X18 X RAY DECT (DISPOSABLE) ×3 IMPLANT
SPONGE LAP 4X18 X RAY DECT (DISPOSABLE) ×6 IMPLANT
STAPLER VISISTAT 35W (STAPLE) IMPLANT
STRIP CLOSURE SKIN 1/2X4 (GAUZE/BANDAGES/DRESSINGS) ×3 IMPLANT
SUCTION FRAZIER TIP 10 FR DISP (SUCTIONS) ×3 IMPLANT
SUPPORT WRAP ARM LG (MISCELLANEOUS) ×3 IMPLANT
SUT ETHIBOND 2 OS 4 DA (SUTURE) ×3 IMPLANT
SUT ETHILON 4 0 PS 2 18 (SUTURE) IMPLANT
SUT FIBERWIRE #2 38 T-5 BLUE (SUTURE) ×18
SUT MNCRL AB 4-0 PS2 18 (SUTURE) ×3 IMPLANT
SUT SILK 2 0 TIES 17X18 (SUTURE) ×3
SUT SILK 2-0 18XBRD TIE BLK (SUTURE) ×2 IMPLANT
SUT VIC AB 0 CTB1 27 (SUTURE) ×3 IMPLANT
SUT VIC AB 2-0 CT1 27 (SUTURE) ×6
SUT VIC AB 2-0 CT1 TAPERPNT 27 (SUTURE) ×4 IMPLANT
SUT VIC AB 2-0 FS1 27 (SUTURE) IMPLANT
SUTURE FIBERWR #2 38 T-5 BLUE (SUTURE) ×10 IMPLANT
SYR CONTROL 10ML LL (SYRINGE) ×3 IMPLANT
SYRINGE TOOMEY DISP (SYRINGE) ×3 IMPLANT
TAPE CLOTH SURG 6X10 WHT LF (GAUZE/BANDAGES/DRESSINGS) ×2 IMPLANT
TOWEL OR 17X24 6PK STRL BLUE (TOWEL DISPOSABLE) ×3 IMPLANT
TOWEL OR 17X26 10 PK STRL BLUE (TOWEL DISPOSABLE) ×3 IMPLANT
TRAY FOLEY CATH 14FR (SET/KITS/TRAYS/PACK) IMPLANT
TUBE CONNECTING 12X1/4 (SUCTIONS) ×3 IMPLANT
WATER STERILE IRR 1000ML POUR (IV SOLUTION) ×3 IMPLANT
YANKAUER SUCT BULB TIP NO VENT (SUCTIONS) ×3 IMPLANT
global unite standard humeral head ×2 IMPLANT
global unite standard stem porocoat ×2 IMPLANT

## 2011-04-13 NOTE — Anesthesia Procedure Notes (Addendum)
Anesthesia Regional Block:  Interscalene brachial plexus block  Pre-Anesthetic Checklist: ,, timeout performed, Correct Patient, Correct Site, Correct Laterality, Correct Procedure, Correct Position, site marked, Risks and benefits discussed, pre-op evaluation,  At surgeon's request and post-op pain management  Laterality: Left  Prep: Maximum Sterile Barrier Precautions used and chloraprep       Needles:  Injection technique: Single-shot  Needle Type: Echogenic Stimulator Needle      Needle Gauge: 22 and 22 G    Additional Needles:  Procedures: ultrasound guided and nerve stimulator Interscalene brachial plexus block  Nerve Stimulator or Paresthesia:  Response: Biceps response, 0.4 mA,   Additional Responses:   Narrative:  Start time: 04/13/2011 10:15 AM End time: 04/13/2011 10:30 AM Injection made incrementally with aspirations every 5 mL. Anesthesiologist: Sampson Goon, MD  Additional Notes: 2% Lidocaine skin wheel.   Interscalene brachial plexus block Procedure Name: Intubation Date/Time: 04/13/2011 10:42 AM Performed by: Elon Alas Pre-anesthesia Checklist: Emergency Drugs available, Patient identified, Timeout performed, Suction available and Patient being monitored Patient Re-evaluated:Patient Re-evaluated prior to inductionOxygen Delivery Method: Circle System Utilized Preoxygenation: Pre-oxygenation with 100% oxygen Intubation Type: IV induction and Cricoid Pressure applied Ventilation: Mask ventilation without difficulty and Oral airway inserted - appropriate to patient size Laryngoscope Size: Mac and 4 Grade View: Grade II Tube type: Oral Tube size: 7.5 mm Number of attempts: 1 Airway Equipment and Method: stylet Placement Confirmation: ETT inserted through vocal cords under direct vision,  positive ETCO2 and breath sounds checked- equal and bilateral Secured at: 23 cm Tube secured with: Tape Dental Injury: Teeth and Oropharynx as per pre-operative  assessment

## 2011-04-13 NOTE — Preoperative (Signed)
Beta Blockers   Reason not to administer Beta Blockers:Not Applicable 
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
## 2011-04-13 NOTE — Transfer of Care (Signed)
Immediate Anesthesia Transfer of Care Note  Patient: Jesus [name]  Procedure(s) Performed: Procedure(s) (LRB): HARDWARE REMOVAL (Left) SHOULDER HEMI-ARTHROPLASTY (Left)  Patient Location: PACU  Anesthesia Type: GA combined with regional for post-op pain  Level of Consciousness: awake, alert  and oriented  Airway & Oxygen Therapy: Patient Spontanous Breathing and Patient connected to nasal cannula oxygen  Post-op Assessment: Report given to PACU RN, Post -op Vital signs reviewed and stable and Patient moving all extremities X 4  Post vital signs: Reviewed and stable  Complications: No apparent anesthesia complications

## 2011-04-13 NOTE — Plan of Care (Signed)
Problem: Consults Goal: Rotator Cuff Repair Patient Education See Patient Education Module for education specifics. Outcome: Completed/Met Date Met:  04/13/11 left shoulder hemiarthroplasty

## 2011-04-13 NOTE — Brief Op Note (Signed)
04/13/2011  1:49 PM  PATIENT:  Jesus [name]  71 y.o. male  PRE-OPERATIVE DIAGNOSIS:  proximal humerus nonunion with painful hardware, left shoulder  POST-OPERATIVE DIAGNOSIS:  proximal humerus nonunion with painful hardware, left shoulder  PROCEDURE:  Procedure(s) (LRB): HARDWARE REMOVAL (Left) and L hemiarthroplasty  SURGEON:  Surgeon(s) and Role:    * Mable Paris, MD - Primary    * Eulas Post, MD - Assisting  PHYSICIAN ASSISTANT:   ASSISTANTS: none   ANESTHESIA:   regional and general  EBL:  Total I/O In: 1000 [I.V.:1000] Out: -   BLOOD ADMINISTERED:none  DRAINS: none   LOCAL MEDICATIONS USED:  NONE  SPECIMEN:  Aspirate  DISPOSITION OF SPECIMEN:  micro  COUNTS:  YES  TOURNIQUET:  * No tourniquets in log *  DICTATION: .Other Dictation: Dictation Number [id number]  PLAN OF CARE: Admit to inpatient   PATIENT DISPOSITION:  PACU - hemodynamically stable.   Delay start of Pharmacological VTE agent (>24hrs) due to surgical blood loss or risk of bleeding: not applicable

## 2011-04-13 NOTE — Anesthesia Preprocedure Evaluation (Signed)
Anesthesia Evaluation  Patient identified by MRN, date of birth, ID band Patient awake    Reviewed: Allergy & Precautions, H&P , NPO status , Patient's Chart, lab work & pertinent test results, reviewed documented beta blocker date and time   Airway Mallampati: II TM Distance: >3 FB Neck ROM: Full    Dental No notable dental hx. (+) Teeth Intact   Pulmonary neg pulmonary ROS,  clear to auscultation  Pulmonary exam normal       Cardiovascular hypertension, On Medications and On Home Beta Blockers + CAD + dysrhythmias Regular Normal    Neuro/Psych Negative Neurological ROS  Negative Psych ROS   GI/Hepatic Neg liver ROS, GERD-  Medicated and Controlled,  Endo/Other  Diabetes mellitus-, Poorly Controlled, Type 1, Insulin Dependent  Renal/GU negative Renal ROS  Genitourinary negative   Musculoskeletal   Abdominal   Peds  Hematology negative hematology ROS (+)   Anesthesia Other Findings   Reproductive/Obstetrics negative OB ROS                           Anesthesia Physical Anesthesia Plan  ASA: III  Anesthesia Plan: General   Post-op Pain Management:    Induction: Intravenous  Airway Management Planned: Oral ETT  Additional Equipment:   Intra-op Plan:   Post-operative Plan: Extubation in OR  Informed Consent: I have reviewed the patients History and Physical, chart, labs and discussed the procedure including the risks, benefits and alternatives for the proposed anesthesia with the patient or authorized representative who has indicated his/her understanding and acceptance.     Plan Discussed with: CRNA  Anesthesia Plan Comments:         Anesthesia Quick Evaluation

## 2011-04-13 NOTE — Anesthesia Postprocedure Evaluation (Signed)
  Anesthesia Post-op Note  Patient: Jesus [name]  Procedure(s) Performed: Procedure(s) (LRB): HARDWARE REMOVAL (Left) SHOULDER HEMI-ARTHROPLASTY (Left)  Patient Location: PACU  Anesthesia Type: GA combined with regional for post-op pain  Level of Consciousness: awake  Airway and Oxygen Therapy: Patient Spontanous Breathing and Patient connected to nasal cannula oxygen  Post-op Pain: none  Post-op Assessment: Post-op Vital signs reviewed, Patient's Cardiovascular Status Stable, Respiratory Function Stable, Patent Airway and No signs of Nausea or vomiting  Post-op Vital Signs: Reviewed and stable  Complications: No apparent anesthesia complications

## 2011-04-14 ENCOUNTER — Inpatient Hospital Stay (HOSPITAL_COMMUNITY): Payer: Medicare Other

## 2011-04-14 ENCOUNTER — Encounter (HOSPITAL_COMMUNITY): Payer: Self-pay | Admitting: Orthopedic Surgery

## 2011-04-14 LAB — CBC
MCH: 27.3 pg (ref 26.0–34.0)
MCV: 82.6 fL (ref 78.0–100.0)
Platelets: 107 10*3/uL — ABNORMAL LOW (ref 150–400)
RDW: 16.3 % — ABNORMAL HIGH (ref 11.5–15.5)

## 2011-04-14 LAB — BASIC METABOLIC PANEL
BUN: 20 mg/dL (ref 6–23)
CO2: 25 mEq/L (ref 19–32)
Calcium: 8.8 mg/dL (ref 8.4–10.5)
Creatinine, Ser: 1.23 mg/dL (ref 0.50–1.35)
GFR calc Af Amer: 67 mL/min — ABNORMAL LOW (ref >90)

## 2011-04-14 LAB — GLUCOSE, CAPILLARY: Glucose-Capillary: 179 mg/dL — ABNORMAL HIGH (ref 70–99)

## 2011-04-14 MED ORDER — OXYCODONE-ACETAMINOPHEN 5-325 MG PO TABS: 1.0000 | ORAL_TABLET | ORAL | Status: AC | PRN

## 2011-04-14 NOTE — Progress Notes (Signed)
UR COMPLETED  

## 2011-04-14 NOTE — Op Note (Signed)
Jesus [name], Jesus [name]           ACCOUNT NO.:  [id number]  MEDICAL RECORD NO.:  [id number]  LOCATION:  5038                         FACILITY:  [facility]  PHYSICIAN:  Jones Broom, MD    DATE OF BIRTH:  [birth year]  DATE OF PROCEDURE:  04/13/2011 DATE OF DISCHARGE:                              OPERATIVE REPORT   PREOPERATIVE DIAGNOSIS:  Painful left shoulder, likely nonunion with failure of open reduction and internal fixation.  POSTOPERATIVE DIAGNOSIS:  Left shoulder nonunion with painful hardware.  PROCEDURE PERFORMED: 1. Left shoulder hemiarthroplasty for nonunion. 2. Left shoulder removal of hardware including plate and screws.  ATTENDING SURGEON:  Jones Broom, MD  ASSISTANT:  Eulas Post, MD ((Dr. Shelba Flake assistance was essential in retraction and positioning during the procedure).  ANESTHESIA:  GETA with preoperative interscalene block.  COMPLICATIONS:  None.  DRAINS:  None.  SPECIMENS:  Superficial and deep cultures were sent.  INDICATION FOR SURGERY:  The patient is a 71 year old diabetic male who had a left shoulder fracture about 5 months ago, which was treated surgically with ORIF.  He had initial near-anatomic reduction, but subsequently lost reduction, went on to have continued pain in the shoulder with penetration of the pegs.  He was indicated for surgical removal of the hardware and likely transition to arthroplastic procedure with either hemiarthroplasty or reverse total shoulder depending on findings at the time of surgery.  He understood risks, benefits, and alternatives of the procedure including, but not limited to risk of bleeding, infection, damage to neurovascular structures, potential for incomplete pain relief, and potential for need for future surgery and elected to go forward with surgery.  OPERATIVE FINDINGS:  Upon making the incision in the very superficial subcutaneous layer, there was a bit of a creamy sort of fluid.  There was  no discrete pocket.  This seemed to be right under the incision, likely previous suture reaction.  Some of this was sent for Gram stain and culture.  Initial Gram stain was negative.  This did not communicate deeply.  Deeply, there were no signs of infection.  The plate and all screws were easily removed.  The lesser tuberosity was taken off of the osteotomy and mobilize the subscapularis.  Extensive subdeltoid adhesions were lysed.  It was noted at this point that the greater tuberosity and head fragment failed to unite to the shaft.  Therefore, the head was completely excised and rotator cuff was completely intact.  An unite DePuy hemiarthroplasty with hybrid cement technique was implanted with good fixation of tuberosities around the implant with the use of bone graft from the humeral head.  PROCEDURE:  The patient was identified in the preoperative holding area where I personally marked the operative site after verifying site, side, and procedure with the patient.  He was taken back to the operating room after an interscalene block was given by the attending anesthesiologist in the holding area.  He had general anesthesia induced without complication and was then placed in the beach-chair position with the back raised about 30 degrees.  Left upper extremity was prepped and draped in a standard sterile fashion.  The patient did receive preoperative antibiotics.  The appropriate time-out procedure was carried out.  The previous incision was marked out and an incision was made.  After the initial incision, there was some creamy-appearing fluid in the subcutaneous layer as described above.  Cultures were sent as well as a stat Gram stain, which was initially negative.  This area was copiously irrigated.  There was no tracking sinus deeply or superficially.  Once the small amount of fluid was washed out, the rest of the tissue really looked benign and healthy.  The  deltopectoral interval was then developed and the underlayer of the deltoid was carefully elevated off of the rotator cuff and lateral humerus.  There was significant amount of adhesion.  The conjoined tendon was developed and the retractor was placed beneath the conjoined tendon medially with the second retractor laterally underneath the deltoid.  The plate was then identified and exposed.  The plate, all screws, and pegs were easily removed.  The lateral edge was then rongeured.  There was no sign of infection here.  Lesser tuberosity was then osteotomized with a large osteotome and the rotator interval was completely opened with a curved Mayo scissor.  The humeral head was then exposed and visualized.  It was sort of inferiorly displaced.  With a Cobb elevator, I was able to get into the fracture site and it was very mobile.  The shaft and head were moving independently at this point.  Therefore, I used a Cobb elevator to completely free up the interval between the head, tuberosity, and shaft.  I then used an osteotome to resect the head from the greater tuberosity.  The entire articular surface was removed in about 3 different pieces.  The posterior and superior greater tuberosities were carefully mobilized from surrounding tissues.  The lesser tuberosity and subscapularis were controlled with sutures and then 2 FiberWire sutures were placed around the inferior bone tendon junction of the posterior rotator cuff and then another 2 were placed around the superior junction.  After control was gained to the tuberosities, the shaft was delivered.  It was then prepared reaming from 6-12 mm.  It was felt a 10 implant would fit best with some cement.  The 10 trial implant was placed in about 25 degrees retroversion and the appropriate height was gauged by reducing the tuberosities around the implant with a 44 x 15 size head.  Once the height was measured and set, the final implant was opened  with the unite stem and the metaphyseal and stem component were fixed together.  Using a hybrid cement technique with just no cement around the stem to pot it, I then impacted the size 10 stem in about 25 degrees retroversion at the measured height.  After the cement was hardened and dried, the 44 x 15 centralized head was impacted and the joint was reduced.  The tuberosities were reduced around the implant, which fit nicely.  There was bone graft from the humeral head, which was placed beneath the tuberosities and then the tuberosities were repaired. I should say that prior to implantation of the stem 2 holes were drilled on either side of the bicipital tuberosity and 2 more #2 fiber wires were passed in a horizontal mattress fashion through these holes for later repair.  The tuberosities were repaired with the 4 sutures around the neck of the implant, 2 going around the greater tuberosity in the implant alone, 2 going around the bone tendon junction of the lesser tuberosity as well.  A vertical tension band was then carried out with two #2 fiber  wires that were placed through drill holes prior to implanting the component.  Prior to closing the joint, I did copiously irrigate with pulse lavaged.  At this point, it was felt that there was complete restoration of the rotator cuff and the tuberosity repair was felt to be excellent.  The wound was copiously irrigated with normal saline and closed in layers with 2-0 Monocryl in a subcutaneous layer and staples for skin closure.  Sterile dressings were then applied including Xeroform, 4x4s, ABDs, and tape.  The patient was placed in a sling, allowed to awaken from general anesthesia, transferred to the stretcher, and taken to the recovery room in stable condition.  POSTOPERATIVE PLAN:  He will be admitted to the hospital and will have 24 hours antibiotics.  We will follow his Gram stain and cultures closely.  He will likely be discharged  tomorrow or the following day as long as he is doing well.     Jones Broom, MD     JC/MEDQ  D:  04/13/2011  T:  04/14/2011  Job:  [id number]
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
## 2011-04-14 NOTE — Progress Notes (Signed)
PATIENT ID: Jesus [name]  MRN: [id number]  DOB/AGE:  January 01, 1941 / 71 y.o.  1 Day Post-Op Procedure(s) (LRB): HARDWARE REMOVAL (Left) SHOULDER HEMI-ARTHROPLASTY (Left)  Subjective: Pain is moderate.  No c/o chest pain or SOB.   Some bleeding through dressing last night.   Objective: Vital signs in last 24 hours: Temp:  [96.8 F (36 C)-98.2 F (36.8 C)] 98 F (36.7 C) (02/13 0535) Pulse Rate:  [68-93] 93  (02/13 0535) Resp:  [10-22] 16  (02/13 0535) BP: (107-137)/(51-70) 134/67 mmHg (02/13 0535) SpO2:  [97 %-100 %] 98 % (02/13 0535) Weight:  [96.389 kg (212 lb 8 oz)] 96.389 kg (212 lb 8 oz) (02/12 1545)  Intake/Output from previous day: 02/12 0701 - 02/13 0700 In: 4010 [P.O.:960; I.V.:2450; IV Piggyback:600] Out: 950 [Urine:775; Blood:175] Intake/Output this shift:     Basename 04/14/11 0505  HGB 10.8*    Basename 04/14/11 0505  WBC 8.5  RBC 3.96*  HCT 32.7*  PLT 107*    Basename 04/14/11 0505  NA 134*  K 3.9  CL 102  CO2 25  BUN 20  CREATININE 1.23  GLUCOSE 183*  CALCIUM 8.8   No results found for this basename: LABPT:2,INR:2 in the last 72 hours  Physical Exam: Neurovascular intact Sensation intact distally Incision: moderate drainage Compartment soft  Assessment/Plan: 1 Day Post-Op Procedure(s) (LRB): HARDWARE REMOVAL (Left) SHOULDER HEMI-ARTHROPLASTY (Left)   Advance diet Up with therapy D/C IV fluids Non Weight Bearing (NWB)  VTE prophylaxis: intermittent pneumatic compression bootsP Post op xr shows posterior piece of greater tuberosity to be elevated.  Discussed this finding with patient.  Further XRs pending.  At this point patient is of opinion that he would not like to go through any further surgery unless necessary and would be happy with result if pain is better from preop.  I think for best functional result, early revision of this fragment may be best option and he understands this.  Will discuss again with pt and daughter after  new xrs. Likely d/c home tomorrow if no further surgery. Reinforce dressing.  Will change tomorrow.   Mable Paris 04/14/2011, 7:37 AM

## 2011-04-14 NOTE — Discharge Instructions (Signed)

## 2011-04-16 LAB — BODY FLUID CULTURE: Culture: NO GROWTH
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
## 2011-04-18 LAB — ANAEROBIC CULTURE: Gram Stain: NONE SEEN

## 2011-09-14 ENCOUNTER — Encounter: Payer: Self-pay | Admitting: Cardiology

## 2011-09-15 ENCOUNTER — Other Ambulatory Visit: Payer: Self-pay | Admitting: Cardiology

## 2012-02-16 ENCOUNTER — Ambulatory Visit (INDEPENDENT_AMBULATORY_CARE_PROVIDER_SITE_OTHER): Payer: Medicare Other | Admitting: Cardiology

## 2012-02-16 ENCOUNTER — Encounter: Payer: Self-pay | Admitting: Cardiology

## 2012-02-16 VITALS — BP 146/76 | HR 86 | Ht 68.0 in | Wt 223.0 lb

## 2012-02-16 DIAGNOSIS — E785 Hyperlipidemia, unspecified: Secondary | ICD-10-CM

## 2012-02-16 DIAGNOSIS — E119 Type 2 diabetes mellitus without complications: Secondary | ICD-10-CM

## 2012-02-16 DIAGNOSIS — I2510 Atherosclerotic heart disease of native coronary artery without angina pectoris: Secondary | ICD-10-CM

## 2012-02-16 DIAGNOSIS — I10 Essential (primary) hypertension: Secondary | ICD-10-CM
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
## 2012-02-16 NOTE — Patient Instructions (Signed)
Continue your current medication  Try and get back into aerobic exercise and lose weight.  I will see you in 6 months.

## 2012-02-21 ENCOUNTER — Telehealth: Payer: Self-pay | Admitting: Cardiology

## 2012-02-21 NOTE — Telephone Encounter (Signed)
Returned Pam's call at [facility] already closed unable to leave message Dr.Jordan advised ok for patient to have surgery.Will call back next week.

## 2012-02-21 NOTE — Telephone Encounter (Signed)
New Problem:    Called in to request a surgical clearance for the patient.  Please call back.

## 2012-02-28 ENCOUNTER — Other Ambulatory Visit: Payer: Self-pay | Admitting: Urology

## 2012-02-28 NOTE — Telephone Encounter (Signed)
Spoke to [facility] at Dr.Wrenn's office was told Dr.Jordan advised ok for patient to have surgery.Message faxed to [facility] at fax # [phone].

## 2012-02-28 NOTE — Telephone Encounter (Signed)
Pam called to follow up surgical clearance so they can schedule pt surgery

## 2012-03-13 ENCOUNTER — Encounter (HOSPITAL_COMMUNITY): Payer: Self-pay | Admitting: Pharmacy Technician

## 2012-03-15 ENCOUNTER — Encounter (HOSPITAL_COMMUNITY)
Admission: RE | Admit: 2012-03-15 | Discharge: 2012-03-15 | Disposition: A | Discharge status: Home / Self Care | Payer: Medicare Other | Source: Ambulatory Visit | Attending: Urology | Admitting: Urology

## 2012-03-15 ENCOUNTER — Encounter (HOSPITAL_COMMUNITY): Payer: Self-pay

## 2012-03-15 HISTORY — DX: Acute myocardial infarction, unspecified: I21.9

## 2012-03-15 LAB — CBC
HCT: 44.1 % (ref 39.0–52.0)
MCV: 87.5 fL (ref 78.0–100.0)
RDW: 14.9 % (ref 11.5–15.5)
WBC: 7.2 10*3/uL (ref 4.0–10.5)

## 2012-03-15 LAB — BASIC METABOLIC PANEL
BUN: 19 mg/dL (ref 6–23)
CO2: 24 mEq/L (ref 19–32)
Chloride: 105 mEq/L (ref 96–112)
Creatinine, Ser: 1.29 mg/dL (ref 0.50–1.35)
GFR calc Af Amer: 63 mL/min — ABNORMAL LOW (ref >90)

## 2012-03-15 NOTE — Patient Instructions (Signed)
Jesus [name]  03/15/2012                           YOUR PROCEDURE IS SCHEDULED ON:  03/23/12               PLEASE REPORT TO SHORT STAY CENTER AT :  5:15 AM               CALL THIS NUMBER IF ANY PROBLEMS THE DAY OF SURGERY :               [phone]                      REMEMBER:   Do not eat food or drink liquids AFTER MIDNIGHT   Take these medicines the morning of surgery with A SIP OF WATER:  METOPROLOL / VESICARE / FLOMAX   Do not wear jewelry, make-up   Do not wear lotions, powders, or perfumes.   Do not shave legs or underarms 12 hrs. before surgery (men may shave face)  Do not bring valuables to the hospital.  Contacts, dentures or bridgework may not be worn into surgery.  Leave suitcase in the car. After surgery it may be brought to your room.  For patients admitted to the hospital more than one night, checkout time is 11:00                          The day of discharge.   Patients discharged the day of surgery will not be allowed to drive home                             If going home same day of surgery, must have someone stay with you first                           24 hrs at home and arrange for some one to drive you home from hospital.    Special Instructions:   Please read over the following fact sheets that you were given:               1. MRSA  INFORMATION                      2. [facility] PREPARING FOR SURGERY SHEET                                                X_____________________________________________________________________        Failure to follow these instructions may result in cancellation of your surgery

## 2012-03-22 NOTE — Anesthesia Preprocedure Evaluation (Addendum)
Anesthesia Evaluation  Patient identified by MRN, date of birth, ID band Patient awake    Reviewed: Allergy & Precautions, H&P , NPO status , Patient's Chart, lab work & pertinent test results, reviewed documented beta blocker date and time   Airway Mallampati: II TM Distance: >3 FB Neck ROM: Full    Dental No notable dental hx. (+) Teeth Intact and Dental Advisory Given   Pulmonary former smoker,  breath sounds clear to auscultation  Pulmonary exam normal       Cardiovascular hypertension, On Medications and On Home Beta Blockers + CAD, + Past MI and + Cardiac Stents + dysrhythmias Rhythm:Regular Rate:Normal     Neuro/Psych negative neurological ROS  negative psych ROS   GI/Hepatic Neg liver ROS, GERD-  Medicated and Controlled,  Endo/Other  diabetes, Poorly Controlled, Type 1, Insulin DependentMorbid obesity  Renal/GU Renal diseasenegative Renal ROS  negative genitourinary   Musculoskeletal   Abdominal   Peds  Hematology negative hematology ROS (+)   Anesthesia Other Findings   Reproductive/Obstetrics negative OB ROS                          Anesthesia Physical Anesthesia Plan  ASA: III  Anesthesia Plan: General   Post-op Pain Management:    Induction: Intravenous  Airway Management Planned: Oral ETT  Additional Equipment:   Intra-op Plan:   Post-operative Plan: Extubation in OR  Informed Consent: I have reviewed the patients History and Physical, chart, labs and discussed the procedure including the risks, benefits and alternatives for the proposed anesthesia with the patient or authorized representative who has indicated his/her understanding and acceptance.   Dental advisory given  Plan Discussed with: CRNA  Anesthesia Plan Comments:         Anesthesia Quick Evaluation

## 2012-03-22 NOTE — H&P (Signed)
istory of Present Illness  Jesus [name] returns today for further evaluation of his voiding symptoms.  He remains on tamsulosin and vesicare for his BPH with BOO and LUTS with OAB.   He did a voiding diary and has frequent 100-150cc voids with nocturia x 3 and a total volume of about 1800cc in a day.  His PVR of 94cc and his PF of only 5cc/sec with a void of only 44cc.  His IPSS is 18/4.   Past Medical History Problems  1. History of  Abdominal Pain In The Right Lower Belly (RLQ) 789.03 2. History of  Acute Myocardial Infarction V12.59 3. History of  Cath Stent Placement 4. History of  Diabetes Mellitus 250.00 5. History of  Esophageal Reflux 530.81 6. History of  Hypercholesterolemia 272.0 7. History of  Hypertension 401.9 8. History of  Microscopic Hematuria 599.72 9. History of  Nephrolithiasis V13.01 10. History of  Nephrolithiasis Of The Right Kidney V13.01 11. History of  Ureteral Stone 592.1  Surgical History Problems  1. History of  Appendectomy 2. History of  Hemorrhoidectomy 3. History of  Leg Repair Right 4. History of  Shoulder Surgery Left  Current Meds 1. Actos 45 MG Oral Tablet; Therapy: (Recorded:09Apr2012) to 2. Aspirin 81 MG Oral Tablet; Therapy: (Recorded:09Apr2012) to 3. Atorvastatin Calcium 20 MG Oral Tablet; Therapy: (Recorded:06Aug2012) to 4. Calcium TABS; Therapy: (Recorded:30Oct2013) to 5. GlyBURIDE-MetFORMIN 5-500 MG Oral Tablet; Therapy: (Recorded:09Apr2012) to 6. Hematinic Plus Complex TABS; Therapy: (Recorded:09Apr2012) to 7. Iron TABS; Therapy: (Recorded:09Apr2012) to 8. Januvia 100 MG Oral Tablet; Therapy: (Recorded:09Apr2012) to 9. Metoprolol Tartrate 50 MG Oral Tablet; Therapy: (Recorded:30Oct2013) to 10. Micardis 40 MG Oral Tablet; Therapy: 28Dec2012 to 11. Multi-Vitamin Oral Tablet; Therapy: (Recorded:09Apr2012) to 12. Niaspan 500 MG Oral Tablet Extended Release; Therapy: 24Oct2011 to 13. OneTouch Ultra Blue In [employer]; Therapy: 16Jul2013  to 14. OneTouch UltraSoft Lancets Miscellaneous; Therapy: 16Jul2013 to 15. Tamsulosin HCl 0.4 MG Oral Capsule; Therapy: (Recorded:09Apr2012) to 16. VESIcare 5 MG Oral Tablet; Take 1 tablet by mouth every day; Therapy: 12Apr2013 to   (Evaluate:14Feb2014)  Requested for: 18Sep2013; Last Rx:17Sep2013  Allergies Medication  1. Camphor CRYS 2. Menthol CRYS 3. Mentholatum OINT  Family History Problems  1. Maternal history of  Bone Cancer 2. Maternal history of  Breast Cancer V16.3 3. Family history of  Death In The Family Father age 5 of heart disease 4. Family history of  Death In The Family Mother age 35 of breast cancer that metatasized to liver 5. Paternal history of  Heart Disease V17.49 6. Fraternal history of  Heart Disease V17.49 7. Maternal history of  Liver Cancer  Social History Problems  1. Marital History - Currently Married 2. Occupation: Retired Airline pilot 3. Tobacco Use V15.82 smoked 3 ppd x 5 years  Quit 38 years ago Denied  4. History of  Alcohol Use 5. History of  Caffeine Use  Vitals Vital Signs [Data Includes: Last 1 Day]  23Dec2013 12:32PM  Blood Pressure: 169 / 88 Temperature: 97.9 F Heart Rate: 80  Results/Data  Urine [Data Includes: Last 1 Day]   23Dec2013  COLOR YELLOW   APPEARANCE CLEAR   SPECIFIC GRAVITY 1.025   pH 5.5   GLUCOSE 250 mg/dL  BILIRUBIN NEG   KETONE NEG mg/dL  BLOOD NEG   PROTEIN TRACE mg/dL  UROBILINOGEN 0.2 mg/dL  NITRITE NEG   LEUKOCYTE ESTERASE NEG    The following medical tests were reviewed: I have reviewed his voiding diary and he has 100-150cc voids every  1.5 to 2 hours day and night. His total output of 1800cc.  Flow Rate: Voided 122 ml. A peak flow rate of 95ml/s, mean flow rate of 7ml/s and reduced but sustained. flow curve .  PVR: Ultrasound PVR 94 ml.  IPSS: The IPSS today is 18  QOL score is 4  21 Feb 2012 12:38 PM   UA With REFLEX       COLOR YELLOW       APPEARANCE CLEAR       SPECIFIC GRAVITY 1.025         pH 5.5       GLUCOSE 250       BILIRUBIN NEG       KETONE NEG       BLOOD NEG       PROTEIN TRACE       UROBILINOGEN 0.2       NITRITE NEG       LEUKOCYTE ESTERASE NEG      Procedure  Procedure: Cystoscopy   Indication: Lower Urinary Tract Symptoms.  Informed Consent: Risks, benefits, and potential adverse events were discussed and informed consent was obtained from the patient.  Prep: The patient was prepped with betadine.  Antibiotic prophylaxis: Ciprofloxacin.  Procedure Note:  Urethral meatus:. No abnormalities.  Anterior urethra: No abnormalities.  Prostatic urethra: No abnormalities . Estimated length was 3 cm. There was visual obstruction of the prostatic urethra. The lateral and median prostatic lobes were enlarged. An enlarged intravesical median lobe was visualized. (small and ball valving).  Bladder: Visulization was clear. The ureteral orifices were in the normal anatomic position bilaterally and had clear efflux of urine. A systematic survey of the bladder demonstrated no bladder tumors or stones. The mucosa was smooth without abnormalities. Examination of the bladder demonstrated moderate trabeculation cellules. The patient tolerated the procedure well.  Complications: None.    Assessment Assessed  1. Benign Prostatic Hypertrophy With Urinary Obstruction 600.01 2. 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 Nocturia 788.43   He has BPH with BOO and an obstructing middle lobe that would not be a good candidate for CTT.   Plan Benign Prostatic Hypertrophy With Urinary Obstruction (600.01)  1. Complex Uroflowmetry  Done: 23Dec2013 2. Cysto  Done: 23Dec2013 3. Follow-up Schedule Surgery Office  Follow-up  Done: 23Dec2013   I am going to get him set up for a TUEVP and reviewed the risks of bleeding, infection, incontinence, sexual dysfunction, strictures, fluid overload, thrombotic events, persistent voiding symptoms and anesthetic complications.    Discussion/Summary      CC: Dr. Salome Spotted and Dr. Peter Swaziland.

## 2012-03-23 ENCOUNTER — Encounter (HOSPITAL_COMMUNITY)
Admission: RE | Disposition: A | Discharge status: Home / Self Care | Payer: Self-pay | Source: Ambulatory Visit | Attending: Urology

## 2012-03-23 ENCOUNTER — Observation Stay (HOSPITAL_COMMUNITY)
Admission: RE | Admit: 2012-03-23 | Discharge: 2012-03-24 | Disposition: A | Discharge status: Home / Self Care | Payer: Medicare Other | Source: Ambulatory Visit | Attending: Urology | Admitting: Urology

## 2012-03-23 ENCOUNTER — Encounter (HOSPITAL_COMMUNITY): Payer: Self-pay | Admitting: Anesthesiology

## 2012-03-23 ENCOUNTER — Ambulatory Visit (HOSPITAL_COMMUNITY): Payer: Medicare Other | Admitting: Anesthesiology

## 2012-03-23 ENCOUNTER — Encounter (HOSPITAL_COMMUNITY): Payer: Self-pay | Admitting: *Deleted

## 2012-03-23 DIAGNOSIS — Z01812 Encounter for preprocedural laboratory examination: Secondary | ICD-10-CM | POA: Insufficient documentation

## 2012-03-23 DIAGNOSIS — E119 Type 2 diabetes mellitus without complications: Secondary | ICD-10-CM | POA: Insufficient documentation

## 2012-03-23 DIAGNOSIS — N320 Bladder-neck obstruction: Secondary | ICD-10-CM | POA: Insufficient documentation

## 2012-03-23 DIAGNOSIS — N318 Other neuromuscular dysfunction of bladder: Secondary | ICD-10-CM | POA: Insufficient documentation

## 2012-03-23 DIAGNOSIS — Z79899 Other long term (current) drug therapy: Secondary | ICD-10-CM | POA: Insufficient documentation

## 2012-03-23 DIAGNOSIS — N400 Enlarged prostate without lower urinary tract symptoms: Secondary | ICD-10-CM

## 2012-03-23 DIAGNOSIS — Z7982 Long term (current) use of aspirin: Secondary | ICD-10-CM | POA: Insufficient documentation

## 2012-03-23 DIAGNOSIS — I252 Old myocardial infarction: Secondary | ICD-10-CM | POA: Insufficient documentation

## 2012-03-23 DIAGNOSIS — R351 Nocturia: Secondary | ICD-10-CM | POA: Insufficient documentation

## 2012-03-23 DIAGNOSIS — K219 Gastro-esophageal reflux disease without esophagitis: Secondary | ICD-10-CM | POA: Insufficient documentation

## 2012-03-23 DIAGNOSIS — E7800 Pure hypercholesterolemia, unspecified: Secondary | ICD-10-CM | POA: Insufficient documentation

## 2012-03-23 DIAGNOSIS — Z01810 Encounter for preprocedural cardiovascular examination: Secondary | ICD-10-CM | POA: Insufficient documentation

## 2012-03-23 DIAGNOSIS — N138 Other obstructive and reflux uropathy: Principal | ICD-10-CM | POA: Insufficient documentation

## 2012-03-23 DIAGNOSIS — N401 Enlarged prostate with lower urinary tract symptoms: Secondary | ICD-10-CM | POA: Insufficient documentation

## 2012-03-23 DIAGNOSIS — I10 Essential (primary) hypertension: Secondary | ICD-10-CM | POA: Insufficient documentation

## 2012-03-23 HISTORY — PX: TRANSURETHRAL RESECTION OF PROSTATE: SHX73

## 2012-03-23 LAB — GLUCOSE, CAPILLARY
Glucose-Capillary: 178 mg/dL — ABNORMAL HIGH (ref 70–99)
Glucose-Capillary: 96 mg/dL (ref 70–99)

## 2012-03-23 SURGERY — TRANSURETHRAL RESECTION OF THE PROSTATE WITH GYRUS INSTRUMENTS
Anesthesia: General | Site: Bladder | Wound class: Clean Contaminated

## 2012-03-23 MED ORDER — CIPROFLOXACIN IN D5W 400 MG/200ML IV SOLN
400.0000 mg | INTRAVENOUS | Status: AC
  Administered 2012-03-23: 400 mg via INTRAVENOUS

## 2012-03-23 MED ORDER — ATORVASTATIN CALCIUM 20 MG PO TABS
20.0000 mg | ORAL_TABLET | Freq: Every day | ORAL | Status: DC
  Administered 2012-03-23: 20 mg via ORAL
  Filled 2012-03-23 (×2): qty 1

## 2012-03-23 MED ORDER — BISACODYL 10 MG RE SUPP: 10.0000 mg | Freq: Every day | RECTAL | Status: DC | PRN

## 2012-03-23 MED ORDER — MIDAZOLAM HCL 5 MG/5ML IJ SOLN
INTRAMUSCULAR | Status: DC | PRN
  Administered 2012-03-23: 2 mg via INTRAVENOUS

## 2012-03-23 MED ORDER — CIPROFLOXACIN IN D5W 400 MG/200ML IV SOLN
INTRAVENOUS | Status: AC
  Filled 2012-03-23: qty 200

## 2012-03-23 MED ORDER — METFORMIN HCL 500 MG PO TABS
1000.0000 mg | ORAL_TABLET | Freq: Two times a day (BID) | ORAL | Status: DC
  Administered 2012-03-23 – 2012-03-24 (×2): 1000 mg via ORAL
  Filled 2012-03-23 (×4): qty 2

## 2012-03-23 MED ORDER — PROPOFOL 10 MG/ML IV BOLUS
INTRAVENOUS | Status: DC | PRN
  Administered 2012-03-23: 100 mg via INTRAVENOUS

## 2012-03-23 MED ORDER — FERROUS SULFATE 325 (65 FE) MG PO TABS
325.0000 mg | ORAL_TABLET | Freq: Every day | ORAL | Status: DC
  Administered 2012-03-23: 325 mg via ORAL
  Filled 2012-03-23 (×3): qty 1

## 2012-03-23 MED ORDER — METOPROLOL TARTRATE 100 MG PO TABS
100.0000 mg | ORAL_TABLET | Freq: Two times a day (BID) | ORAL | Status: DC
Start: 2012-03-23 — End: 2012-03-24
  Administered 2012-03-23: 100 mg via ORAL
  Filled 2012-03-23 (×3): qty 1

## 2012-03-23 MED ORDER — GLYBURIDE 5 MG PO TABS
10.0000 mg | ORAL_TABLET | Freq: Two times a day (BID) | ORAL | Status: DC
  Administered 2012-03-23 – 2012-03-24 (×2): 10 mg via ORAL
  Filled 2012-03-23 (×4): qty 2

## 2012-03-23 MED ORDER — DOCUSATE SODIUM 100 MG PO CAPS
100.0000 mg | ORAL_CAPSULE | Freq: Two times a day (BID) | ORAL | Status: DC
  Administered 2012-03-23 (×2): 100 mg via ORAL
  Filled 2012-03-23 (×4): qty 1

## 2012-03-23 MED ORDER — ROCURONIUM BROMIDE 100 MG/10ML IV SOLN
INTRAVENOUS | Status: DC | PRN
  Administered 2012-03-23: 40 mg via INTRAVENOUS

## 2012-03-23 MED ORDER — LACTATED RINGERS IV SOLN
INTRAVENOUS | Status: DC | PRN
  Administered 2012-03-23: 07:00:00 via INTRAVENOUS

## 2012-03-23 MED ORDER — LINAGLIPTIN 5 MG PO TABS
5.0000 mg | ORAL_TABLET | Freq: Every day | ORAL | Status: DC
  Administered 2012-03-23: 5 mg via ORAL
  Filled 2012-03-23 (×2): qty 1

## 2012-03-23 MED ORDER — ACETAMINOPHEN 325 MG PO TABS: 650.0000 mg | ORAL_TABLET | ORAL | Status: DC | PRN

## 2012-03-23 MED ORDER — CIPROFLOXACIN HCL 500 MG PO TABS
500.0000 mg | ORAL_TABLET | Freq: Two times a day (BID) | ORAL | Status: DC
  Filled 2012-03-23 (×3): qty 1

## 2012-03-23 MED ORDER — NITROGLYCERIN 0.4 MG SL SUBL: 0.4000 mg | SUBLINGUAL_TABLET | SUBLINGUAL | Status: DC | PRN

## 2012-03-23 MED ORDER — SODIUM CHLORIDE 0.9 % IR SOLN
Status: DC | PRN
  Administered 2012-03-23 (×4): 3000 mL

## 2012-03-23 MED ORDER — PROMETHAZINE HCL 25 MG/ML IJ SOLN: 6.2500 mg | INTRAMUSCULAR | Status: DC | PRN

## 2012-03-23 MED ORDER — CIPROFLOXACIN HCL 500 MG PO TABS
500.0000 mg | ORAL_TABLET | Freq: Two times a day (BID) | ORAL | Status: DC
  Administered 2012-03-23 – 2012-03-24 (×2): 500 mg via ORAL
  Filled 2012-03-23 (×4): qty 1

## 2012-03-23 MED ORDER — HYDROMORPHONE HCL PF 1 MG/ML IJ SOLN: 0.5000 mg | INTRAMUSCULAR | Status: DC | PRN

## 2012-03-23 MED ORDER — LACTATED RINGERS IV SOLN
INTRAVENOUS | Status: DC
  Administered 2012-03-23: 09:00:00 via INTRAVENOUS

## 2012-03-23 MED ORDER — ZOLPIDEM TARTRATE 5 MG PO TABS: 5.0000 mg | ORAL_TABLET | Freq: Every evening | ORAL | Status: DC | PRN

## 2012-03-23 MED ORDER — FENTANYL CITRATE 0.05 MG/ML IJ SOLN
INTRAMUSCULAR | Status: DC | PRN
  Administered 2012-03-23 (×4): 50 ug via INTRAVENOUS

## 2012-03-23 MED ORDER — HYDROCODONE-ACETAMINOPHEN 5-325 MG PO TABS: 1.0000 | ORAL_TABLET | Freq: Four times a day (QID) | ORAL | Status: DC | PRN

## 2012-03-23 MED ORDER — ACETAMINOPHEN 10 MG/ML IV SOLN
INTRAVENOUS | Status: AC
  Filled 2012-03-23: qty 100

## 2012-03-23 MED ORDER — HYDROMORPHONE HCL PF 1 MG/ML IJ SOLN: 0.2500 mg | INTRAMUSCULAR | Status: DC | PRN

## 2012-03-23 MED ORDER — IRON 325 (65 FE) MG PO TABS: 325.0000 mg | ORAL_TABLET | Freq: Every day | ORAL | Status: DC

## 2012-03-23 MED ORDER — NIACIN ER (ANTIHYPERLIPIDEMIC) 500 MG PO TBCR
500.0000 mg | EXTENDED_RELEASE_TABLET | Freq: Every day | ORAL | Status: DC
  Administered 2012-03-23: 500 mg via ORAL
  Filled 2012-03-23 (×2): qty 1

## 2012-03-23 MED ORDER — DOCUSATE SODIUM 100 MG PO CAPS: 100.0000 mg | ORAL_CAPSULE | Freq: Two times a day (BID) | ORAL | Status: DC

## 2012-03-23 MED ORDER — INSULIN ASPART 100 UNIT/ML ~~LOC~~ SOLN
0.0000 [IU] | Freq: Three times a day (TID) | SUBCUTANEOUS | Status: DC
  Administered 2012-03-23 – 2012-03-24 (×2): 3 [IU] via SUBCUTANEOUS

## 2012-03-23 MED ORDER — PIOGLITAZONE HCL 45 MG PO TABS
22.5000 mg | ORAL_TABLET | Freq: Every day | ORAL | Status: DC
  Administered 2012-03-23: 22.5 mg via ORAL
  Filled 2012-03-23 (×2): qty 0.5

## 2012-03-23 MED ORDER — ONDANSETRON HCL 4 MG/2ML IJ SOLN: 4.0000 mg | INTRAMUSCULAR | Status: DC | PRN

## 2012-03-23 MED ORDER — LIDOCAINE HCL (CARDIAC) 20 MG/ML IV SOLN
INTRAVENOUS | Status: DC | PRN
  Administered 2012-03-23: 8 mg via INTRAVENOUS

## 2012-03-23 MED ORDER — IRBESARTAN 75 MG PO TABS
75.0000 mg | ORAL_TABLET | Freq: Every day | ORAL | Status: DC
  Administered 2012-03-23: 75 mg via ORAL
  Filled 2012-03-23 (×3): qty 1

## 2012-03-23 MED ORDER — POTASSIUM CHLORIDE IN NACL 20-0.45 MEQ/L-% IV SOLN
INTRAVENOUS | Status: DC
  Administered 2012-03-23 (×2): via INTRAVENOUS
  Filled 2012-03-23 (×3): qty 1000

## 2012-03-23 MED ORDER — GLYBURIDE-METFORMIN 5-500 MG PO TABS: 2.0000 | ORAL_TABLET | Freq: Two times a day (BID) | ORAL | Status: DC

## 2012-03-23 MED ORDER — ADULT MULTIVITAMIN W/MINERALS CH
1.0000 | ORAL_TABLET | Freq: Every day | ORAL | Status: DC
  Administered 2012-03-23: 1 via ORAL
  Filled 2012-03-23 (×2): qty 1

## 2012-03-23 MED ORDER — HYOSCYAMINE SULFATE 0.125 MG SL SUBL
0.1250 mg | SUBLINGUAL_TABLET | SUBLINGUAL | Status: DC | PRN
  Filled 2012-03-23: qty 1

## 2012-03-23 MED ORDER — HYDROCODONE-ACETAMINOPHEN 5-325 MG PO TABS: 1.0000 | ORAL_TABLET | ORAL | Status: DC | PRN

## 2012-03-23 SURGICAL SUPPLY — 24 items
BAG URINE DRAINAGE (UROLOGICAL SUPPLIES) ×1 IMPLANT
BAG URO CATCHER STRL LF (DRAPE) ×2 IMPLANT
BLADE SURG 15 STRL LF DISP TIS (BLADE) IMPLANT
BLADE SURG 15 STRL SS (BLADE)
CATH FOLEY 3WAY 30CC 22FR (CATHETERS) IMPLANT
CLOTH BEACON ORANGE TIMEOUT ST (SAFETY) ×2 IMPLANT
DRAPE CAMERA CLOSED 9X96 (DRAPES) ×2 IMPLANT
ELECT BUTTON HF 24-28F 2 30DE (ELECTRODE) ×1 IMPLANT
ELECT HF RESECT BIPO 24F 45 ND (CUTTING LOOP) ×1 IMPLANT
ELECT LOOP MED HF 24F 12D (CUTTING LOOP) ×1 IMPLANT
ELECT REM PT RETURN 9FT ADLT (ELECTROSURGICAL) ×2
ELECT RESECT VAPORIZE 12D CBL (ELECTRODE) ×1 IMPLANT
ELECTRODE REM PT RTRN 9FT ADLT (ELECTROSURGICAL) ×1 IMPLANT
GLOVE SURG SS PI 8.0 STRL IVOR (GLOVE) ×2 IMPLANT
GOWN PREVENTION PLUS XLARGE (GOWN DISPOSABLE) ×2 IMPLANT
GOWN STRL REIN XL XLG (GOWN DISPOSABLE) ×2 IMPLANT
HOLDER FOLEY CATH W/STRAP (MISCELLANEOUS) ×1 IMPLANT
KIT ASPIRATION TUBING (SET/KITS/TRAYS/PACK) ×2 IMPLANT
MANIFOLD NEPTUNE II (INSTRUMENTS) ×2 IMPLANT
PACK CYSTO (CUSTOM PROCEDURE TRAY) ×2 IMPLANT
SUT ETHILON 3 0 PS 1 (SUTURE) IMPLANT
SYR 30ML LL (SYRINGE) ×1 IMPLANT
SYRINGE IRR TOOMEY STRL 70CC (SYRINGE) ×1 IMPLANT
TUBING CONNECTING 10 (TUBING) ×2 IMPLANT

## 2012-03-23 NOTE — Op Note (Signed)
NAMETREYVONNE, TATA           ACCOUNT NO.:  [id number]  MEDICAL RECORD NO.:  [id number]  LOCATION:  1409                         FACILITY:  [facility]  PHYSICIAN:  Excell Seltzer. Annabell Howells, M.D.    DATE OF BIRTH:  [birth year]  DATE OF PROCEDURE:  03/23/2012 DATE OF DISCHARGE:                              OPERATIVE REPORT   PROCEDURE:  Transurethral electrovaporization of the prostate.  PREOPERATIVE DIAGNOSIS:  Benign prostatic hypertrophy, bladder outlet obstruction.  POSTOPERATIVE DIAGNOSIS:  Benign prostatic hypertrophy, bladder outlet obstruction.  SURGEON:  Excell Seltzer. Annabell Howells, M.D.  ANESTHESIA:  General.  SPECIMEN:  None.  DRAINS:  A 22-French Foley catheter.  BLOOD LOSS:  Minimal.  COMPLICATIONS:  None.  INDICATIONS:  Mr. Viglione is a 72 year old white male with BPH and bladder outlet obstruction.  He has elected to undergo TUR of the prostate.  FINDINGS OF PROCEDURE:  He was taken to the operating room where he was given Cipro.  A general anesthetic was induced.  He was placed in lithotomy position.  He was fitted with PAS hose.  His perineum and genitalia were prepped with Betadine solution and draped in usual sterile fashion.  Cystoscopy was performed using a 22-French scope and 12-degree lens. Examination revealed a normal urethra.  The external sphincter was intact.  The prostatic urethra was approximately 3 cm in length with trilobar hyperplasia with obstruction.  Examination of the bladder revealed mild-to-moderate trabeculation with no tumor, stones, or inflammation noted.  The ureteral orifices were unremarkable.  Once cystoscopy was performed, the 28-French continuous flow resectoscope sheath was inserted.  This was fitted with an Wandra Scot handle with 12 degree lens and a gyrus button electrode with saline for the irrigant.  The middle lobe was then vaporized from 5-7 o'clock, down to the bladder neck fibers.  The floor of the prostate was then vaporized out  to alongside the verumontanum.  The left lobe of the prostate was vaporized from bladder neck to apex, creating an adequate channel.  The right lobe was then vaporized in a similar fashion from bladder neck to apex.  At this point, some residual apical and anterior tissue was vaporized to complete the procedure.  The bladder was drained and the channel was inspected.  Some bleeders at the bladder neck and apex were then fulgurated.  The bladder was then refilled.  Inspection revealed excellent TUR defect with no active bleeding and no retained tissue.  The ureteral orifices were intact. The external sphincter was intact.  The scope was removed and pressure on the bladder produced an excellent stream.  A 22-French Foley catheter was inserted.  The balloon was filled with 30 mL of sterile fluid.  The catheter was placed to straight drainage.  The patient was taken down from lithotomy position.  His anesthetic was reversed.  He was moved to recovery room in stable condition.  There were no complications.     Excell Seltzer. Annabell Howells, M.D.     JJW/MEDQ  D:  03/23/2012  T:  03/23/2012  Job:  [id number]  cc:   Peter M. Swazil[place], M.D. Fax: [phone]  Ralene Ok, M.D. Fax: [phone]

## 2012-03-23 NOTE — Anesthesia Postprocedure Evaluation (Signed)
Anesthesia Post Note  Patient: Jesus [name]  Procedure(s) Performed: Procedure(s) (LRB): TRANSURETHRAL RESECTION OF THE PROSTATE WITH GYRUS INSTRUMENTS (N/A)  Anesthesia type: General  Patient location: PACU  Post pain: Pain level controlled  Post assessment: Post-op Vital signs reviewed  Last Vitals:  Filed Vitals:   03/23/12 0900  BP:   Pulse:   Temp:   Resp: 12    Post vital signs: Reviewed  Level of consciousness: sedated  Complications: No apparent anesthesia complications

## 2012-03-23 NOTE — Interval H&P Note (Signed)
History and Physical Interval Note:  03/23/2012 7:25 AM  Jesus [name]  has presented today for surgery, with the diagnosis of BPH WITH BLADDER OUTLET OBSTRUCTION  The various methods of treatment have been discussed with the patient and family. After consideration of risks, benefits and other options for treatment, the patient has consented to  Procedure(s) (LRB) with comments: TRANSURETHRAL RESECTION OF THE PROSTATE WITH GYRUS INSTRUMENTS (N/A) as a surgical intervention .  The patient's history has been reviewed, patient examined, no change in status, stable for surgery.  I have reviewed the patient's chart and labs.  Questions were answered to the patient's satisfaction.     [name]

## 2012-03-23 NOTE — Progress Notes (Signed)
Patient ID: Jesus [name], male   DOB: Jan 05, 1941, 72 y.o.   MRN: [id number]  He is doing well post op with pink urine and no complaints.  He had some questions about his code status and I explained why I had him listed as full code.   He understands is ok with the current designation.  Dr. Margarita Grizzle will see him in the morning.

## 2012-03-23 NOTE — Brief Op Note (Signed)
03/23/2012  8:44 AM  PATIENT:  Jesus [name]  72 y.o. male  PRE-OPERATIVE DIAGNOSIS:  BPH WITH BLADDER OUTLET OBSTRUCTION  POST-OPERATIVE DIAGNOSIS:  BPH WITH BLADDER OUTLET OBSTRUCTION  PROCEDURE:  Procedure(s) (LRB) with comments: TRANSURETHRAL ELECTROVAPORIZATION OF THE PROSTATE WITH GYRUS INSTRUMENTS (N/A)  SURGEON:  Surgeon(s) and Role:    * Anner Crete, MD - Primary  PHYSICIAN ASSISTANT:   ASSISTANTS: none   ANESTHESIA:   general  EBL:  Total I/O In: 950 [I.V.:950] Out: -   BLOOD ADMINISTERED:none  DRAINS: Urinary Catheter (Foley)   LOCAL MEDICATIONS USED:  NONE  SPECIMEN:  No Specimen  DISPOSITION OF SPECIMEN:  N/A  COUNTS:  YES  TOURNIQUET:  * No tourniquets in log *  DICTATION: .Other Dictation: Dictation Number [id number]  PLAN OF CARE: Admit for overnight observation  PATIENT DISPOSITION:  PACU - hemodynamically stable.   Delay start of Pharmacological VTE agent (>24hrs) due to surgical blood loss or risk of bleeding: yes

## 2012-03-23 NOTE — Transfer of Care (Signed)
Immediate Anesthesia Transfer of Care Note  Patient: Jesus [name]  Procedure(s) Performed: Procedure(s) (LRB) with comments: TRANSURETHRAL RESECTION OF THE PROSTATE WITH GYRUS INSTRUMENTS (N/A)  Patient Location: PACU  Anesthesia Type:General  Level of Consciousness: awake, alert  and oriented  Airway & Oxygen Therapy: Patient Spontanous Breathing and Patient connected to face mask oxygen  Post-op Assessment: Report given to PACU RN and Post -op Vital signs reviewed and stable  Post vital signs: Reviewed and stable  Complications: No apparent anesthesia complications

## 2012-03-24 ENCOUNTER — Encounter (HOSPITAL_COMMUNITY): Payer: Self-pay | Admitting: Urology

## 2012-03-24 NOTE — Progress Notes (Signed)
Urology Progress Note  Subjective:     No acute urologic events overnight. Positive flatus. Negative bowel movement. Tolerating regular diet. Foley catheter was removed this morning and reported to me as being light pink by the nursing staff. Patient denies abdominal pain.  ROS: Negative: SOB  Objective:  Patient Vitals for the past 24 hrs:  BP Temp Temp src Pulse Resp SpO2 Height Weight  03/24/12 0535 140/72 mmHg 97.9 F (36.6 C) Oral 79  16  96 % - -  03/23/12 2207 153/73 mmHg 97.8 F (36.6 C) Oral 78  16  98 % - -  03/23/12 1207 141/66 mmHg 97.5 F (36.4 C) - - 15  100 % 5\' 8"  (1.727 m) 98.884 kg (218 lb)  03/23/12 1145 121/51 mmHg 97.5 F (36.4 C) - - 11  - - -  03/23/12 1100 121/51 mmHg 97.5 F (36.4 C) - - - - - -  03/23/12 1000 143/74 mmHg - - 71  9  98 % - -  03/23/12 0945 149/71 mmHg - - 73  10  98 % - -  03/23/12 0930 149/72 mmHg - - 77  10  100 % - -  03/23/12 0915 151/73 mmHg - - 78  12  100 % - -  03/23/12 0900 146/69 mmHg - - 76  12  100 % - -  03/23/12 0847 140/74 mmHg 97.6 F (36.4 C) - 81  16  100 % - -    Physical Exam: General:  No acute distress, awake Cardiovascular:    [x]   S1/S2 present, RRR  []   Irregularly irregular Chest:  CTA-B Abdomen:               []  Soft, appropriately TTP  [x]  Soft, NTTP  []  Soft, appropriately TTP, incision(s) clean/dry/intact  Genitourinary: Negative edema. Negative foley. Foley:  None    I/O last 3 completed shifts: In: 4010 [P.O.:960; I.V.:3050] Out: 3675 [Urine:3675]  No results found for this basename: HGB:2,WBC:2,PLT:2 in the last 72 hours  No results found for this basename: NA:2,K:2,CL:2,CO2:2,BUN:2,CREATININE:2,CALCIUM:2,MAGNESIUM:2,GFRNONAA:2,GFRAA:2 in the last 72 hours   No results found for this basename: PT:2,INR:2,APTT:2 in the last 72 hours   No components found with this basename: ABG:2    Length of stay: 1 days.  Assessment: BPH. POD#1 Transurethral vaporization of the  prostate.   Plan: -Discontinue foley. 3-cup test. -Likely d/c home today.   Natalia Leatherwood, MD [phone]

## 2012-03-24 NOTE — Discharge Summary (Signed)
Physician Discharge Summary  Patient ID: Jesus [name] MRN: [id number] DOB/AGE: [birth year] 72 y.o.  Admit date: 03/23/2012 Discharge date: 03/24/2012  Admission Diagnoses: BPH  Discharge Diagnoses:  BPH  Discharged Condition: good  Hospital Course:  This patient was admitted overnight for observation with a Foley catheter following transurethral vaporization of the prostate for an enlarged prostate. He did not require continuous bladder irrigation. He was able to tolerate a regular diet. He did not have significant pain. His urine remained light pink in the Foley catheter was removed early on postoperative day one. He completed a 3 cup voiding trial and his urine stayed adequately clear. It was felt that he could be discharged home.  Consults: None  Significant Diagnostic Studies: None  Treatments: surgery: Transurethral of operation of the prostate  Discharge Exam: Blood pressure 140/72, pulse 79, temperature 97.9 F (36.6 C), temperature source Oral, resp. rate 16, height 5\' 8"  (1.727 m), weight 98.884 kg (218 lb), SpO2 96.00%. Refer to PE from progress note on date of discharge.  Disposition: 01-Home or Self Care  Discharge Orders    Future Orders Please Complete By Expires   Discharge patient          Medication List     As of 03/24/2012  7:19 AM    STOP taking these medications         FLOMAX 0.4 MG Caps   Generic drug: Tamsulosin HCl      TAKE these medications         aspirin 81 MG tablet   Take 81 mg by mouth every morning.      atorvastatin 20 MG tablet   Commonly known as: LIPITOR   Take 20 mg by mouth at bedtime.      docusate sodium 100 MG capsule   Commonly known as: COLACE   Take 1 capsule (100 mg total) by mouth 2 (two) times daily.      glyBURIDE-metformin 5-500 MG per tablet   Commonly known as: GLUCOVANCE   Take 2 tablets by mouth 2 (two) times daily with a meal.      HEMATINIC PLUS COMPLEX PO   Take 1 tablet by mouth daily.     HYDROcodone-acetaminophen 5-325 MG per tablet   Commonly known as: NORCO/VICODIN   Take 1 tablet by mouth every 6 (six) hours as needed for pain.      insulin lispro 100 UNIT/ML injection   Commonly known as: HUMALOG   Inject 6-12 Units into the skin. Sliding scale, sugar over 160      Iron 325 (65 FE) MG Tabs   Take 325 mg by mouth daily.      metoprolol 100 MG tablet   Commonly known as: LOPRESSOR   Take 100 mg by mouth 2 (two) times daily.      multivitamin with minerals Tabs   Take 1 tablet by mouth daily.      niacin 500 MG CR tablet   Commonly known as: NIASPAN   Take 500 mg by mouth at bedtime.      nitroGLYCERIN 0.4 MG SL tablet   Commonly known as: NITROSTAT   Place 0.4 mg under the tongue every 5 (five) minutes as needed. For chest pain      pioglitazone 45 MG tablet   Commonly known as: ACTOS   Take 22.5 mg by mouth daily.      sitaGLIPtin 100 MG tablet   Commonly known as: JANUVIA   Take 100 mg by mouth daily.  solifenacin 5 MG tablet   Commonly known as: VESICARE   Take 5 mg by mouth daily.      telmisartan 40 MG tablet   Commonly known as: MICARDIS   Take 40 mg by mouth every morning.           Follow-up Information    Follow up with Anner Crete, MD. On 04/03/2012. (215)    Contact information:   [address] [place] [address] [phone]          Signed: Milford Cage 03/24/2012, 7:19 AM

## 2012-04-15 ENCOUNTER — Other Ambulatory Visit: Payer: Self-pay

## 2012-10-04 ENCOUNTER — Other Ambulatory Visit: Payer: Self-pay

## 2013-01-04 ENCOUNTER — Other Ambulatory Visit: Payer: Self-pay

## 2013-03-30 ENCOUNTER — Ambulatory Visit: Payer: Medicare Other | Admitting: Cardiology

## 2013-04-06 ENCOUNTER — Encounter: Payer: Self-pay | Admitting: Cardiology

## 2013-04-09 ENCOUNTER — Encounter: Payer: Self-pay | Admitting: Cardiology

## 2013-04-09 ENCOUNTER — Ambulatory Visit (INDEPENDENT_AMBULATORY_CARE_PROVIDER_SITE_OTHER): Payer: Medicare Other | Admitting: Cardiology

## 2013-04-09 VITALS — BP 162/86 | HR 77 | Ht 68.0 in | Wt 216.8 lb

## 2013-04-09 DIAGNOSIS — I2510 Atherosclerotic heart disease of native coronary artery without angina pectoris: Secondary | ICD-10-CM

## 2013-04-09 DIAGNOSIS — I452 Bifascicular block: Secondary | ICD-10-CM

## 2013-04-09 DIAGNOSIS — E119 Type 2 diabetes mellitus without complications: Secondary | ICD-10-CM

## 2013-04-09 DIAGNOSIS — E785 Hyperlipidemia, unspecified: Secondary | ICD-10-CM

## 2013-04-09 DIAGNOSIS — I10 Essential (primary) hypertension: Secondary | ICD-10-CM

## 2013-04-09 MED ORDER — ATORVASTATIN CALCIUM 40 MG PO TABS: 40.0000 mg | ORAL_TABLET | Freq: Every day | ORAL | Status: DC

## 2013-04-09 NOTE — Patient Instructions (Signed)
We will increase atorvastatin to 40 mg daily  Stop taking Niaspan   Continue your other therapy  I will see you in one year
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
## 2014-04-18 ENCOUNTER — Encounter: Payer: Self-pay | Admitting: Cardiology

## 2014-04-18 ENCOUNTER — Telehealth: Payer: Self-pay | Admitting: Cardiology

## 2014-04-18 ENCOUNTER — Ambulatory Visit (INDEPENDENT_AMBULATORY_CARE_PROVIDER_SITE_OTHER): Payer: Medicare Other | Admitting: Cardiology

## 2014-04-18 VITALS — BP 152/72 | HR 90 | Ht 68.0 in | Wt 229.8 lb

## 2014-04-18 DIAGNOSIS — I2510 Atherosclerotic heart disease of native coronary artery without angina pectoris: Secondary | ICD-10-CM

## 2014-04-18 DIAGNOSIS — I10 Essential (primary) hypertension: Secondary | ICD-10-CM

## 2014-04-18 DIAGNOSIS — I452 Bifascicular block: Secondary | ICD-10-CM

## 2014-04-18 DIAGNOSIS — E119 Type 2 diabetes mellitus without complications: Secondary | ICD-10-CM

## 2014-04-18 NOTE — Telephone Encounter (Signed)
Please call,just left here.

## 2014-04-18 NOTE — Telephone Encounter (Signed)
Returned call to patient he stated he noticed 2 medication errors on discharge sheet when he returned home.Stated he takes Glipizide/Metformin 5/500 mg 2 tablets twice a day.Takes Metoprolol Succ 50 mg daily.Correction made in chart.
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
## 2014-04-18 NOTE — Patient Instructions (Signed)
We will schedule you for a nuclear stress test at your convenience  Continue your current therapy  I will get your lab results from Dr. Ludwig ClarksMoreira

## 2014-05-23 ENCOUNTER — Telehealth (HOSPITAL_COMMUNITY): Payer: Self-pay

## 2014-05-23 NOTE — Telephone Encounter (Signed)
Encounter complete. 

## 2014-05-24 ENCOUNTER — Telehealth (HOSPITAL_COMMUNITY): Payer: Self-pay

## 2014-05-24 NOTE — Telephone Encounter (Signed)
Encounter complete. 

## 2014-05-28 ENCOUNTER — Ambulatory Visit (HOSPITAL_COMMUNITY)
Admission: RE | Admit: 2014-05-28 | Discharge: 2014-05-28 | Disposition: A | Discharge status: Home / Self Care | Payer: Medicare Other | Source: Ambulatory Visit | Attending: Cardiology | Admitting: Cardiology

## 2014-05-28 DIAGNOSIS — I452 Bifascicular block: Secondary | ICD-10-CM | POA: Diagnosis not present

## 2014-05-28 DIAGNOSIS — I2510 Atherosclerotic heart disease of native coronary artery without angina pectoris: Secondary | ICD-10-CM | POA: Diagnosis not present

## 2014-05-28 DIAGNOSIS — E785 Hyperlipidemia, unspecified: Secondary | ICD-10-CM | POA: Diagnosis not present

## 2014-05-28 DIAGNOSIS — E669 Obesity, unspecified: Secondary | ICD-10-CM | POA: Insufficient documentation

## 2014-05-28 DIAGNOSIS — Z87891 Personal history of nicotine dependence: Secondary | ICD-10-CM | POA: Insufficient documentation

## 2014-05-28 DIAGNOSIS — Z794 Long term (current) use of insulin: Secondary | ICD-10-CM | POA: Insufficient documentation

## 2014-05-28 DIAGNOSIS — E119 Type 2 diabetes mellitus without complications: Secondary | ICD-10-CM

## 2014-05-28 DIAGNOSIS — I10 Essential (primary) hypertension: Secondary | ICD-10-CM

## 2014-05-28 DIAGNOSIS — I4510 Unspecified right bundle-branch block: Secondary | ICD-10-CM | POA: Diagnosis not present

## 2014-05-28 DIAGNOSIS — Z8249 Family history of ischemic heart disease and other diseases of the circulatory system: Secondary | ICD-10-CM | POA: Insufficient documentation

## 2014-05-28 MED ORDER — TECHNETIUM TC 99M SESTAMIBI GENERIC - CARDIOLITE
31.5000 | Freq: Once | INTRAVENOUS | Status: AC | PRN
Start: 2014-05-28 — End: 2014-05-28
  Administered 2014-05-28: 32 via INTRAVENOUS

## 2014-05-28 MED ORDER — TECHNETIUM TC 99M SESTAMIBI GENERIC - CARDIOLITE
10.6000 | Freq: Once | INTRAVENOUS | Status: AC | PRN
  Administered 2014-05-28: 11 via INTRAVENOUS

## 2014-05-28 NOTE — Procedures (Addendum)
[facility] [address] [place] [address] [phone]  Cardiology Nuclear Med Study  Jesus [name] is a 74 y.o. male     MRN : [id number]     DOB: Feb 14, 1941  Procedure Date: 05/28/2014  Nuclear Med Background Indication for Stress Test:  Follow up CAD History:  CAD;STENT/PTCA-RCA;Prior NUC MPI /none available in EPIC for comparison;MI Cardiac Risk Factors: Family History - CAD, History of Smoking, Hypertension, IDDM Type 2, Lipids, Obesity and RBBB  Symptoms:  Pt denies symptoms at this time.   Nuclear Pre-Procedure Caffeine/Decaff Intake:  9:00pm NPO After: 5:00am   IV Site: R Forearm  IV 0.9% NS with Angio Cath:  22g  Chest Size (in):  44"  IV Started by: Berdie OgrenAmanda Wease, RN  Height: 5\' 8"  (1.727 m)  Cup Size: n/a  BMI:  Body mass index is 34.83 kg/(m^2). Weight:  229 lb (103.874 kg)   Tech Comments:  n/a    Nuclear Med Study 1 or 2 day study: 1 day  Stress Test Type:  Stress  Order Authorizing Provider:  Peter SwazilandJordan, MD   Resting Radionuclide: Technetium 5970m Sestamibi  Resting Radionuclide Dose: 10.6 mCi   Stress Radionuclide:  Technetium 570m Sestamibi  Stress Radionuclide Dose: 31.5 mCi           Stress Protocol Rest HR: 69 Stress HR:127  Rest BP: 152/76 Stress BP: 195/70  Exercise Time (min): 5:43 METS: 7.00   Predicted Max HR: 148 bpm % Max HR: 85.81 bpm Rate Pressure Product: [id number]  Dose of Adenosine (mg):  n/a Dose of Lexiscan: n/a mg  Dose of Atropine (mg): n/a Dose of Dobutamine:  n/a  Stress Test Technologist: Ernestene MentionGwen Farrington, CCT Nuclear Technologist:Elizabeth Young,CNMT   Rest Procedure:  Myocardial perfusion imaging was performed at rest 45 minutes following the intravenous administration of Technetium 7170m Sestamibi. Stress Procedure:  The patient performed treadmill exercise using a Bruce  Protocol for 5 minutes 43 seconds. The patient stopped due to extreme shortness of breath and  fatigue. Patient denied any chest pain.  There were no significant ST-T wave changes.  Technetium 6370m Sestamibi was injected  IV at peak exercise and myocardial perfusion imaging was performed after a brief delay.  Transient Ischemic Dilatation (Normal <1.22):  0.87  QGS EDV:  89 ml QGS ESV:  26 ml LV Ejection Fraction: 70%        Rest ECG: NSR-RBBB  Stress ECG: No significant change from baseline ECG  QPS Raw Data Images:  Normal; no motion artifact; normal heart/lung ratio. Stress Images:  There is decreased uptake in the inferior wall. Rest Images:  Normal homogeneous uptake in all areas of the myocardium. Subtraction (SDS):  These findings are consistent with ischemia.  Impression Exercise Capacity:  Good exercise capacity. BP Response:  Normal blood pressure response. Clinical Symptoms:  No significant symptoms noted. ECG Impression:  No significant ST segment change suggestive of ischemia. Comparison with Prior Nuclear Study: No images to compare  Overall Impression:  Low risk stress nuclear study Mild inferobasal ischemia. In patient with previous RCA intervention, may require further evaluation. Follow up with Dr. SwazilandJordan.  LV Wall Motion:  NL LV Function; NL Wall Motion   Runell GessBERRY,Neel Buffone J, MD  05/28/2014 2:43 PM

## 2014-08-26 ENCOUNTER — Other Ambulatory Visit: Payer: Self-pay

## 2014-11-29 ENCOUNTER — Encounter (HOSPITAL_COMMUNITY): Payer: Self-pay | Admitting: Emergency Medicine

## 2014-11-29 ENCOUNTER — Emergency Department (HOSPITAL_COMMUNITY)
Admission: EM | Admit: 2014-11-29 | Discharge: 2014-11-29 | Disposition: A | Discharge status: Home / Self Care | Payer: Medicare Other | Attending: Emergency Medicine | Admitting: Emergency Medicine

## 2014-11-29 DIAGNOSIS — I2510 Atherosclerotic heart disease of native coronary artery without angina pectoris: Secondary | ICD-10-CM | POA: Diagnosis not present

## 2014-11-29 DIAGNOSIS — Z87828 Personal history of other (healed) physical injury and trauma: Secondary | ICD-10-CM | POA: Diagnosis not present

## 2014-11-29 DIAGNOSIS — E119 Type 2 diabetes mellitus without complications: Secondary | ICD-10-CM | POA: Diagnosis not present

## 2014-11-29 DIAGNOSIS — Z87442 Personal history of urinary calculi: Secondary | ICD-10-CM | POA: Insufficient documentation

## 2014-11-29 DIAGNOSIS — I10 Essential (primary) hypertension: Secondary | ICD-10-CM | POA: Diagnosis not present

## 2014-11-29 DIAGNOSIS — Y92410 Unspecified street and highway as the place of occurrence of the external cause: Secondary | ICD-10-CM | POA: Diagnosis not present

## 2014-11-29 DIAGNOSIS — Z794 Long term (current) use of insulin: Secondary | ICD-10-CM | POA: Insufficient documentation

## 2014-11-29 DIAGNOSIS — E785 Hyperlipidemia, unspecified: Secondary | ICD-10-CM | POA: Insufficient documentation

## 2014-11-29 DIAGNOSIS — Z87891 Personal history of nicotine dependence: Secondary | ICD-10-CM | POA: Insufficient documentation

## 2014-11-29 DIAGNOSIS — Y9389 Activity, other specified: Secondary | ICD-10-CM | POA: Insufficient documentation

## 2014-11-29 DIAGNOSIS — S0121XA Laceration without foreign body of nose, initial encounter: Secondary | ICD-10-CM | POA: Diagnosis present

## 2014-11-29 DIAGNOSIS — D649 Anemia, unspecified: Secondary | ICD-10-CM | POA: Diagnosis not present

## 2014-11-29 DIAGNOSIS — I252 Old myocardial infarction: Secondary | ICD-10-CM | POA: Diagnosis not present

## 2014-11-29 DIAGNOSIS — Y998 Other external cause status: Secondary | ICD-10-CM | POA: Diagnosis not present

## 2014-11-29 DIAGNOSIS — K219 Gastro-esophageal reflux disease without esophagitis: Secondary | ICD-10-CM | POA: Insufficient documentation

## 2014-11-29 DIAGNOSIS — Z7982 Long term (current) use of aspirin: Secondary | ICD-10-CM | POA: Diagnosis not present

## 2014-11-29 DIAGNOSIS — Z79899 Other long term (current) drug therapy: Secondary | ICD-10-CM | POA: Diagnosis not present

## 2014-11-29 DIAGNOSIS — Z87448 Personal history of other diseases of urinary system: Secondary | ICD-10-CM | POA: Diagnosis not present
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
## 2014-12-10 ENCOUNTER — Ambulatory Visit
Admission: RE | Admit: 2014-12-10 | Discharge: 2014-12-10 | Disposition: A | Discharge status: Home / Self Care | Payer: Medicare Other | Source: Ambulatory Visit | Attending: Internal Medicine | Admitting: Internal Medicine

## 2014-12-10 ENCOUNTER — Other Ambulatory Visit: Payer: Self-pay | Admitting: Internal Medicine

## 2014-12-10 DIAGNOSIS — R079 Chest pain, unspecified: Secondary | ICD-10-CM
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
## 2019-09-10 ENCOUNTER — Ambulatory Visit: Payer: Medicare PPO | Admitting: Cardiology

## 2019-09-10 ENCOUNTER — Other Ambulatory Visit: Payer: Self-pay

## 2019-09-10 ENCOUNTER — Encounter: Payer: Self-pay | Admitting: Cardiology

## 2019-09-10 VITALS — BP 112/68 | HR 65 | Temp 97.1°F | Ht 68.0 in | Wt 242.0 lb

## 2019-09-10 DIAGNOSIS — E785 Hyperlipidemia, unspecified: Secondary | ICD-10-CM

## 2019-09-10 DIAGNOSIS — E119 Type 2 diabetes mellitus without complications: Secondary | ICD-10-CM

## 2019-09-10 DIAGNOSIS — I25118 Atherosclerotic heart disease of native coronary artery with other forms of angina pectoris: Secondary | ICD-10-CM

## 2019-09-10 MED ORDER — NITROGLYCERIN 0.4 MG SL SUBL: 0.4000 mg | SUBLINGUAL_TABLET | SUBLINGUAL | 6 refills | Status: DC | PRN

## 2020-05-13 ENCOUNTER — Encounter: Payer: Self-pay | Admitting: Orthopedic Surgery

## 2020-05-13 ENCOUNTER — Ambulatory Visit (INDEPENDENT_AMBULATORY_CARE_PROVIDER_SITE_OTHER): Payer: Medicare PPO

## 2020-05-13 ENCOUNTER — Other Ambulatory Visit: Payer: Self-pay

## 2020-05-13 ENCOUNTER — Ambulatory Visit: Payer: Medicare PPO | Admitting: Physician Assistant

## 2020-05-13 DIAGNOSIS — G8929 Other chronic pain: Secondary | ICD-10-CM

## 2020-05-13 DIAGNOSIS — M25561 Pain in right knee: Secondary | ICD-10-CM

## 2020-05-13 MED ORDER — METHYLPREDNISOLONE ACETATE 40 MG/ML IJ SUSP
40.0000 mg | INTRAMUSCULAR | Status: AC | PRN
  Administered 2020-05-13: 40 mg via INTRA_ARTICULAR

## 2020-05-13 MED ORDER — LIDOCAINE HCL 1 % IJ SOLN
5.0000 mL | INTRAMUSCULAR | Status: AC | PRN
  Administered 2020-05-13: 5 mL
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
## 2020-06-05 ENCOUNTER — Ambulatory Visit: Payer: Medicare PPO | Admitting: Physician Assistant

## 2020-06-05 ENCOUNTER — Encounter: Payer: Self-pay | Admitting: Physician Assistant

## 2020-06-05 DIAGNOSIS — M25561 Pain in right knee: Secondary | ICD-10-CM
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
## 2020-06-27 ENCOUNTER — Other Ambulatory Visit: Payer: Self-pay

## 2020-06-27 ENCOUNTER — Ambulatory Visit
Admission: RE | Admit: 2020-06-27 | Discharge: 2020-06-27 | Disposition: A | Discharge status: Home / Self Care | Payer: Medicare PPO | Source: Ambulatory Visit | Attending: Internal Medicine | Admitting: Internal Medicine

## 2020-06-27 ENCOUNTER — Other Ambulatory Visit: Payer: Self-pay | Admitting: Internal Medicine

## 2020-06-27 DIAGNOSIS — M7918 Myalgia, other site: Secondary | ICD-10-CM

## 2020-06-27 DIAGNOSIS — M79659 Pain in unspecified thigh: Secondary | ICD-10-CM

## 2020-07-04 ENCOUNTER — Other Ambulatory Visit: Payer: Self-pay | Admitting: Internal Medicine

## 2020-07-04 DIAGNOSIS — I2510 Atherosclerotic heart disease of native coronary artery without angina pectoris: Secondary | ICD-10-CM

## 2020-07-04 DIAGNOSIS — I998 Other disorder of circulatory system: Secondary | ICD-10-CM

## 2020-07-09 ENCOUNTER — Ambulatory Visit
Admission: RE | Admit: 2020-07-09 | Discharge: 2020-07-09 | Disposition: A | Discharge status: Home / Self Care | Payer: Medicare PPO | Source: Ambulatory Visit | Attending: Internal Medicine | Admitting: Internal Medicine

## 2020-07-09 DIAGNOSIS — I2510 Atherosclerotic heart disease of native coronary artery without angina pectoris: Secondary | ICD-10-CM

## 2020-07-09 DIAGNOSIS — I998 Other disorder of circulatory system: Secondary | ICD-10-CM

## 2020-07-14 ENCOUNTER — Encounter: Payer: Self-pay | Admitting: Family

## 2020-07-14 ENCOUNTER — Ambulatory Visit: Payer: Medicare PPO | Admitting: Family

## 2020-07-14 ENCOUNTER — Ambulatory Visit: Payer: Self-pay

## 2020-07-14 DIAGNOSIS — M79605 Pain in left leg: Secondary | ICD-10-CM

## 2020-07-14 DIAGNOSIS — M79604 Pain in right leg: Secondary | ICD-10-CM | POA: Diagnosis not present

## 2020-07-14 DIAGNOSIS — M5416 Radiculopathy, lumbar region: Secondary | ICD-10-CM | POA: Diagnosis not present

## 2020-07-14 MED ORDER — PREDNISONE 50 MG PO TABS
ORAL_TABLET | ORAL | 0 refills | Status: DC
Start: 2020-07-14 — End: 2020-09-24

## 2020-07-14 MED ORDER — HYDROCODONE-ACETAMINOPHEN 5-325 MG PO TABS: 1.0000 | ORAL_TABLET | Freq: Three times a day (TID) | ORAL | 0 refills | Status: DC | PRN
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
## 2020-08-01 ENCOUNTER — Encounter: Payer: Self-pay | Admitting: Family

## 2020-08-01 ENCOUNTER — Ambulatory Visit (INDEPENDENT_AMBULATORY_CARE_PROVIDER_SITE_OTHER): Payer: Medicare PPO | Admitting: Family

## 2020-08-01 DIAGNOSIS — M79605 Pain in left leg: Secondary | ICD-10-CM

## 2020-08-01 DIAGNOSIS — M5416 Radiculopathy, lumbar region: Secondary | ICD-10-CM | POA: Diagnosis not present

## 2020-08-01 DIAGNOSIS — M79604 Pain in right leg: Secondary | ICD-10-CM | POA: Diagnosis not present

## 2020-08-01 MED ORDER — HYDROCODONE-ACETAMINOPHEN 5-325 MG PO TABS: 1.0000 | ORAL_TABLET | Freq: Four times a day (QID) | ORAL | 0 refills | Status: DC | PRN

## 2020-08-01 MED ORDER — METHOCARBAMOL 500 MG PO TABS: 500.0000 mg | ORAL_TABLET | Freq: Three times a day (TID) | ORAL | 0 refills | Status: DC
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
## 2020-08-09 ENCOUNTER — Other Ambulatory Visit: Payer: Self-pay

## 2020-08-09 ENCOUNTER — Ambulatory Visit
Admission: RE | Admit: 2020-08-09 | Discharge: 2020-08-09 | Disposition: A | Discharge status: Home / Self Care | Payer: Medicare PPO | Source: Ambulatory Visit | Attending: Family | Admitting: Family

## 2020-08-09 DIAGNOSIS — M5416 Radiculopathy, lumbar region: Secondary | ICD-10-CM

## 2020-08-09 DIAGNOSIS — M79605 Pain in left leg: Secondary | ICD-10-CM

## 2020-08-13 ENCOUNTER — Telehealth: Payer: Self-pay | Admitting: Orthopedic Surgery

## 2020-08-13 NOTE — Telephone Encounter (Signed)
Pt called stating he had an appt with Erin on 08/01/20 and was referred to Dr. Alvester Morin for an inj. Pt is wanting to know how necessary the appt with Dr. Alvester Morin is; he would like a CB to discuss if he can cancel it.  [phone]

## 2020-08-14 NOTE — Telephone Encounter (Signed)
I called pt and his question was is it possible for him to try TENS unit vs injection with FN. He has a friend with a similar condition and the TENS unit worked for him. He does not know a lot about this and wanted to know Dr. Audrie Lia thoughts. Will hold and discuss and call pt back this evening.

## 2020-08-14 NOTE — Telephone Encounter (Signed)
I called pt and advised that per Dr. Lajoyce Corners that the [facility] provides a longer lasting benefit than the TENS unit but that we could schedule him with therapy upstairs if he was interested in this   He is sch with [facility] and will keep this appt and call with any other questions.

## 2020-08-14 NOTE — Telephone Encounter (Signed)
Pt called again wanting to know if he should keep his appt with Alvester Morin on 08/18/20?

## 2020-08-18 ENCOUNTER — Ambulatory Visit: Payer: Self-pay

## 2020-08-18 ENCOUNTER — Other Ambulatory Visit: Payer: Self-pay

## 2020-08-18 ENCOUNTER — Ambulatory Visit: Payer: Medicare PPO | Admitting: Physical Medicine and Rehabilitation

## 2020-08-18 ENCOUNTER — Encounter: Payer: Self-pay | Admitting: Physical Medicine and Rehabilitation

## 2020-08-18 VITALS — BP 112/68 | HR 67

## 2020-08-18 DIAGNOSIS — M5416 Radiculopathy, lumbar region: Secondary | ICD-10-CM

## 2020-08-18 DIAGNOSIS — M48062 Spinal stenosis, lumbar region with neurogenic claudication: Secondary | ICD-10-CM | POA: Diagnosis not present

## 2020-08-18 DIAGNOSIS — M5441 Lumbago with sciatica, right side: Secondary | ICD-10-CM

## 2020-08-18 DIAGNOSIS — M5442 Lumbago with sciatica, left side: Secondary | ICD-10-CM

## 2020-08-18 DIAGNOSIS — M4317 Spondylolisthesis, lumbosacral region: Secondary | ICD-10-CM | POA: Diagnosis not present

## 2020-08-18 DIAGNOSIS — G8929 Other chronic pain: Secondary | ICD-10-CM

## 2020-08-18 DIAGNOSIS — M47816 Spondylosis without myelopathy or radiculopathy, lumbar region: Secondary | ICD-10-CM | POA: Diagnosis not present

## 2020-08-18 MED ORDER — METHYLPREDNISOLONE ACETATE 80 MG/ML IJ SUSP
80.0000 mg | Freq: Once | INTRAMUSCULAR | Status: AC
  Administered 2020-08-18: 80 mg

## 2020-08-18 NOTE — Patient Instructions (Signed)

## 2020-08-18 NOTE — Progress Notes (Signed)
Pt state lower back pain that travels down the back of both legs. Pt state he feels sharp pain down his legs. Pt state walking and any pressure on his legs makes the pain worse. Pt state he takes pain meds to help ease his pain. Pt had a inj for his knee and his sugar level went up to 300.  Numeric Pain Rating Scale and Functional Assessment Average Pain 6   In the last MONTH (on 0-10 scale) has pain interfered with the following?  1. General activity like being  able to carry out your everyday physical activities such as walking, climbing stairs, carrying groceries, or moving a chair?  Rating(9)   +Driver, -BT, -Dye Allergies.

## 2020-08-23 ENCOUNTER — Encounter: Payer: Self-pay | Admitting: Physical Medicine and Rehabilitation

## 2020-08-23 NOTE — Progress Notes (Signed)
Jesus [name] - 80 y.o. male MRN [id number]  Date of birth: 03/24/1940  Office Visit Note: Visit Date: 08/18/2020 PCP: Ralene Ok, MD Referred by: Ralene Ok, MD  Subjective: Chief Complaint  Patient presents with   Lower Back - Pain   Left Leg - Pain   Right Leg - Pain   HPI:  Jesus [name] is a 80 y.o. male who comes in today at the request of Dr. Aldean Baker for planned Bilateral S1-2 Lumbar Transforaminal epidural steroid injection with fluoroscopic guidance.  The patient has failed conservative care including home exercise, medications, time and activity modification.  This injection will be diagnostic and hopefully therapeutic.  Please see requesting physician notes for further details and justification.  He describes pain in the low back and hips as well as the legs.  The leg symptoms I think will help from the epidural injection.  His back pain is likely from the facet arthropathy and listhesis of L5 on S1.  We did show this to him on the MRI.  MRI was reviewed today at length.  His average pain is 6 out of 10 but can be fairly high at times with standing and walking.  He says walking and putting pressure on his legs is really what hurts.  He denies any focal numbness although he has a history of numbness and tingling in the legs.  He is a diabetic.  He reports prior injection in the knee caused his blood sugar to rise to 300.  He has had no focal weakness otherwise.  Review of Systems  Musculoskeletal:  Positive for back pain and joint pain.       Lateral leg pain  Neurological:  Positive for tingling.  All other systems reviewed and are negative. Otherwise per HPI.  Assessment & Plan: Visit Diagnoses:    ICD-10-CM   1. Lumbar radiculopathy  M54.16 XR C-ARM NO REPORT    Epidural Steroid injection    methylPREDNISolone acetate (DEPO-MEDROL) injection 80 mg    2. Spinal stenosis of lumbar region with neurogenic claudication  M48.062     3. Spondylosis  without myelopathy or radiculopathy, lumbar region  M47.816     4. Spondylolisthesis of lumbosacral region  M43.17     5. Chronic bilateral low back pain with bilateral sciatica  M54.42    M54.41    G89.29       Plan: Findings:  1.  Bilateral radicular leg pain somewhat of an L5 and S1 distribution somewhat mostly down the back of the leg.  He does have some paresthesias be also has diabetes at possible history of neuropathy.  He has no focal weakness.  We are going to complete the S1 transforaminal injection today bilaterally.  Would consider L5.  Did review MRI with him.  He will continue with current medications.  2.  Low back pain bilateral hip pain worse with standing and going from sit to stand.  He does have facet arthropathy with listhesis.  Consideration for pain generated by the facet joints different than the radicular pain.  Could consider at some point medial branch blocks and radiofrequency ablation.  This was talked about today in the exam.   Meds & Orders:  Meds ordered this encounter  Medications   methylPREDNISolone acetate (DEPO-MEDROL) injection 80 mg    Orders Placed This Encounter  Procedures   XR C-ARM NO REPORT   Epidural Steroid injection    Follow-up: Return if symptoms worsen or fail to improve.  Procedures: No procedures performed  S1 Lumbosacral Transforaminal Epidural Steroid Injection - Sub-Pedicular Approach with Fluoroscopic Guidance   Patient: Jesus [name]      Date of Birth: November 13, 1940 MRN: [id number] PCP: Ralene Ok, MD      Visit Date: 08/18/2020   Universal Protocol:    Date/Time: 06/25/224:53 PM  Consent Given By: the patient  Position:  PRONE  Additional Comments: Vital signs were monitored before and after the procedure. Patient was prepped and draped in the usual sterile fashion. The correct patient, procedure, and site was verified.   Injection Procedure Details:  Procedure Site One Meds Administered:  Meds  ordered this encounter  Medications   methylPREDNISolone acetate (DEPO-MEDROL) injection 80 mg    Laterality: Bilateral  Location/Site:  S1 Foramen   Needle size: 22 ga.  Needle type: Spinal  Needle Placement: Transforaminal  Findings:   -Comments: Excellent flow of contrast along the nerve, nerve root and into the epidural space.  Epidurogram: Contrast epidurogram showed no nerve root cut off or restricted flow pattern.  Procedure Details: After squaring off the sacral end-plate to get a true AP view, the C-arm was positioned so that the best possible view of the S1 foramen was visualized. The soft tissues overlying this structure were infiltrated with 2-3 ml. of 1% Lidocaine without Epinephrine.    The spinal needle was inserted toward the target using a "trajectory" view along the fluoroscope beam.  Under AP and lateral visualization, the needle was advanced so it did not puncture dura. Biplanar projections were used to confirm position. Aspiration was confirmed to be negative for CSF and/or blood. A 1-2 ml. volume of Isovue-250 was injected and flow of contrast was noted at each level. Radiographs were obtained for documentation purposes.   After attaining the desired flow of contrast documented above, a 0.5 to 1.0 ml test dose of 0.25% Marcaine was injected into each respective transforaminal space.  The patient was observed for 90 seconds post injection.  After no sensory deficits were reported, and normal lower extremity motor function was noted,   the above injectate was administered so that equal amounts of the injectate were placed at each foramen (level) into the transforaminal epidural space.   Additional Comments:  The patient tolerated the procedure well Dressing: Band-Aid with 2 x 2 sterile gauze    Post-procedure details: Patient was observed during the procedure. Post-procedure instructions were reviewed.  Patient left the clinic in stable condition.    Clinical History: MRI LUMBAR SPINE WITHOUT CONTRAST   TECHNIQUE: Multiplanar, multisequence MR imaging of the lumbar spine was performed. No intravenous contrast was administered.   COMPARISON:  Radiograph 07/14/2020   FINDINGS: Segmentation:  Standard.   Alignment:  Physiologic.   Vertebrae:  No fracture, evidence of discitis, or bone lesion.   Conus medullaris and cauda equina: Conus extends to the L1 level. Conus and cauda equina appear normal.   Paraspinal and other soft tissues: Mild paraspinal muscle atrophy. There are multiple large bilateral renal cysts, similar to prior CT in 2012.   Disc levels: There is multilevel disc desiccation and facet arthritis.   T11-T12: Mild disc bulging resulting in no significant spinal canal or neural foraminal narrowing.   T12-L1: No significant spinal canal or neural foraminal narrowing.   L1-L2: Right-sided foraminal disc protrusion and facet arthropathy results in mild right neural foraminal narrowing.   L2-L3: Foraminal disc protrusions and facet arthropathy results in mild bilateral neural foraminal narrowing.   L3-L4: Broad-based disc bulging,  ligamentum flavum hypertrophy, and facet arthropathy result in moderate bilateral neural foraminal narrowing. Mild spinal canal narrowing.   L4-L5: Trace, grade 1 anterolisthesis. Broad-based disc bulging, ligamentum flavum hypertrophy, and facet arthropathy result in moderate-severe bilateral neural foraminal narrowing. Moderate spinal canal narrowing. Significant left-sided facet effusion.   L5-S1: Mild disc bulging and facet arthropathy. Mild left neural foraminal narrowing.   IMPRESSION: Multilevel degenerative changes of the lumbar spine, worst at L4-L5, where there is grade 1 anterolisthesis, broad-based disc bulging and facet degenerative change resulting in moderate-severe bilateral neural foraminal narrowing and moderate spinal canal narrowing. Left-sided facet  effusion at L4-L5.   Moderate bilateral neural foraminal narrowing at L3-L4. Additional degenerative changes with mild narrowing at multiple levels as described above.     Electronically Signed   By: Caprice Renshaw   On: 08/10/2020 11:40     Objective:  VS:  HT:    WT:   BMI:     BP:112/68  HR:67bpm  TEMP: ( )  RESP:  Physical Exam Vitals and nursing note reviewed.  Constitutional:      General: He is not in acute distress.    Appearance: Normal appearance. He is not ill-appearing.  HENT:     Head: Normocephalic and atraumatic.     Right Ear: External ear normal.     Left Ear: External ear normal.     Nose: No congestion.  Eyes:     Extraocular Movements: Extraocular movements intact.  Cardiovascular:     Rate and Rhythm: Normal rate.     Pulses: Normal pulses.  Pulmonary:     Effort: Pulmonary effort is normal. No respiratory distress.  Abdominal:     General: There is no distension.     Palpations: Abdomen is soft.  Musculoskeletal:        General: No tenderness or signs of injury.     Cervical back: Neck supple.     Right lower leg: No edema.     Left lower leg: No edema.     Comments: Patient has good distal strength without clonus. Patient somewhat slow to rise from a seated position to full extension.  There is concordant low back pain with facet loading and lumbar spine extension rotation.  There are no definitive trigger points but the patient is somewhat tender across the lower back and PSIS.  There is no pain with hip rotation.   Skin:    Findings: No erythema or rash.  Neurological:     General: No focal deficit present.     Mental Status: He is alert and oriented to person, place, and time.     Sensory: No sensory deficit.     Motor: No weakness or abnormal muscle tone.     Coordination: Coordination normal.  Psychiatric:        Mood and Affect: Mood normal.        Behavior: Behavior normal.     Imaging: No results found.

## 2020-08-23 NOTE — Procedures (Signed)
S1 Lumbosacral Transforaminal Epidural Steroid Injection - Sub-Pedicular Approach with Fluoroscopic Guidance   Patient: Jesus [name]      Date of Birth: 1940-08-01 MRN: [id number] PCP: Ralene Ok, MD      Visit Date: 08/18/2020   Universal Protocol:    Date/Time: 06/25/224:53 PM  Consent Given By: the patient  Position:  PRONE  Additional Comments: Vital signs were monitored before and after the procedure. Patient was prepped and draped in the usual sterile fashion. The correct patient, procedure, and site was verified.   Injection Procedure Details:  Procedure Site One Meds Administered:  Meds ordered this encounter  Medications   methylPREDNISolone acetate (DEPO-MEDROL) injection 80 mg    Laterality: Bilateral  Location/Site:  S1 Foramen   Needle size: 22 ga.  Needle type: Spinal  Needle Placement: Transforaminal  Findings:   -Comments: Excellent flow of contrast along the nerve, nerve root and into the epidural space.  Epidurogram: Contrast epidurogram showed no nerve root cut off or restricted flow pattern.  Procedure Details: After squaring off the sacral end-plate to get a true AP view, the C-arm was positioned so that the best possible view of the S1 foramen was visualized. The soft tissues overlying this structure were infiltrated with 2-3 ml. of 1% Lidocaine without Epinephrine.    The spinal needle was inserted toward the target using a "trajectory" view along the fluoroscope beam.  Under AP and lateral visualization, the needle was advanced so it did not puncture dura. Biplanar projections were used to confirm position. Aspiration was confirmed to be negative for CSF and/or blood. A 1-2 ml. volume of Isovue-250 was injected and flow of contrast was noted at each level. Radiographs were obtained for documentation purposes.   After attaining the desired flow of contrast documented above, a 0.5 to 1.0 ml test dose of 0.25% Marcaine was injected into  each respective transforaminal space.  The patient was observed for 90 seconds post injection.  After no sensory deficits were reported, and normal lower extremity motor function was noted,   the above injectate was administered so that equal amounts of the injectate were placed at each foramen (level) into the transforaminal epidural space.   Additional Comments:  The patient tolerated the procedure well Dressing: Band-Aid with 2 x 2 sterile gauze    Post-procedure details: Patient was observed during the procedure. Post-procedure instructions were reviewed.  Patient left the clinic in stable condition.
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
## 2020-09-24 ENCOUNTER — Ambulatory Visit: Payer: Medicare PPO | Admitting: Cardiology

## 2020-09-24 ENCOUNTER — Other Ambulatory Visit: Payer: Self-pay

## 2020-09-24 ENCOUNTER — Encounter: Payer: Self-pay | Admitting: Cardiology

## 2020-09-24 VITALS — BP 128/60 | HR 65 | Ht 68.0 in | Wt 217.0 lb

## 2020-09-24 DIAGNOSIS — I10 Essential (primary) hypertension: Secondary | ICD-10-CM

## 2020-09-24 DIAGNOSIS — I25118 Atherosclerotic heart disease of native coronary artery with other forms of angina pectoris: Secondary | ICD-10-CM

## 2020-09-24 DIAGNOSIS — E785 Hyperlipidemia, unspecified: Secondary | ICD-10-CM | POA: Diagnosis not present

## 2020-09-24 DIAGNOSIS — E119 Type 2 diabetes mellitus without complications: Secondary | ICD-10-CM | POA: Diagnosis not present
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
## 2021-06-19 ENCOUNTER — Encounter: Payer: Self-pay | Admitting: Cardiology

## 2021-06-19 ENCOUNTER — Ambulatory Visit: Payer: Medicare PPO | Admitting: Cardiology

## 2021-06-19 VITALS — BP 120/64 | HR 84 | Ht 66.5 in | Wt 215.6 lb

## 2021-06-19 DIAGNOSIS — I25118 Atherosclerotic heart disease of native coronary artery with other forms of angina pectoris: Secondary | ICD-10-CM

## 2021-06-19 DIAGNOSIS — I10 Essential (primary) hypertension: Secondary | ICD-10-CM

## 2021-06-19 DIAGNOSIS — E119 Type 2 diabetes mellitus without complications: Secondary | ICD-10-CM | POA: Diagnosis not present

## 2021-06-19 DIAGNOSIS — E785 Hyperlipidemia, unspecified: Secondary | ICD-10-CM

## 2021-06-19 MED ORDER — NITROGLYCERIN 0.4 MG SL SUBL: 0.4000 mg | SUBLINGUAL_TABLET | SUBLINGUAL | 6 refills | Status: DC | PRN

## 2021-07-14 ENCOUNTER — Other Ambulatory Visit: Payer: Self-pay | Admitting: Internal Medicine

## 2021-07-14 DIAGNOSIS — N1832 Chronic kidney disease, stage 3b: Secondary | ICD-10-CM

## 2021-07-16 ENCOUNTER — Ambulatory Visit
Admission: RE | Admit: 2021-07-16 | Discharge: 2021-07-16 | Disposition: A | Discharge status: Home / Self Care | Payer: Medicare PPO | Source: Ambulatory Visit | Attending: Internal Medicine | Admitting: Internal Medicine

## 2021-07-16 DIAGNOSIS — N1832 Chronic kidney disease, stage 3b: Secondary | ICD-10-CM

## 2021-11-06 IMAGING — MR MR LUMBAR SPINE W/O CM
4 of 5 series · 26 of 48 positions shown · non-contrast
Comparison: Radiograph 07/14/2020

CLINICAL DATA: Lumbar radiculopathy, no red flags

EXAM:
MRI LUMBAR SPINE WITHOUT CONTRAST
TECHNIQUE: Multiplanar, multisequence MR imaging of the lumbar spine was
performed. No intravenous contrast was administered.

[Series 3: T2 · sagittal · 4.0mm · 1.13mm/px · 5 of 17 slices shown (1 of 2)]
[im 1/17]
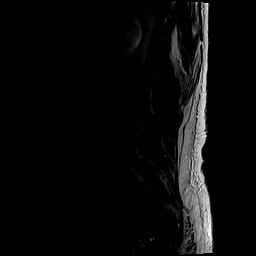
[im 5/17]
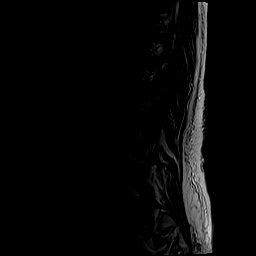
[im 9/17]
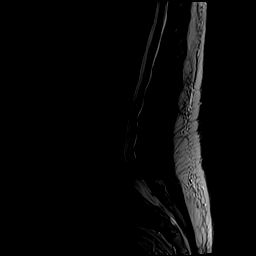
[im 13/17]
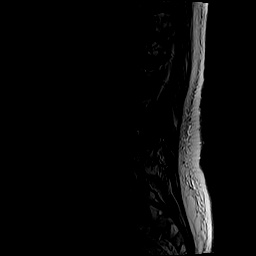
[im 17/17]
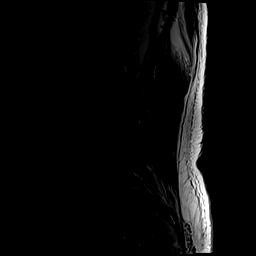

[Series 5: T1 · sagittal · 4.0mm · 1.13mm/px · 6 of 17 slices shown (1 of 2)]
[im 1/17]
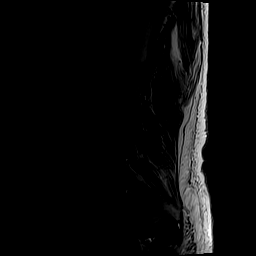
[im 4/17]
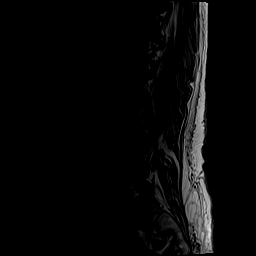
[im 7/17]
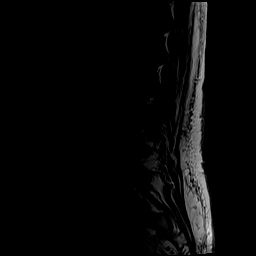
[im 10/17]
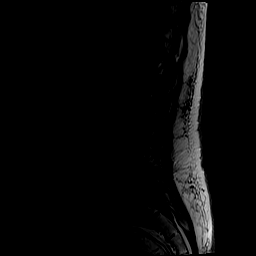
[im 13/17]
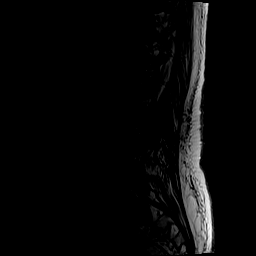
[im 17/17]
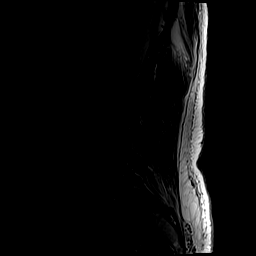

[Series 6: T2 · axial · 4.0mm · 0.39mm/px · z∈[-33,+183]mm · 10 of 47 slices shown (2 of 2)]
[im 4/47]
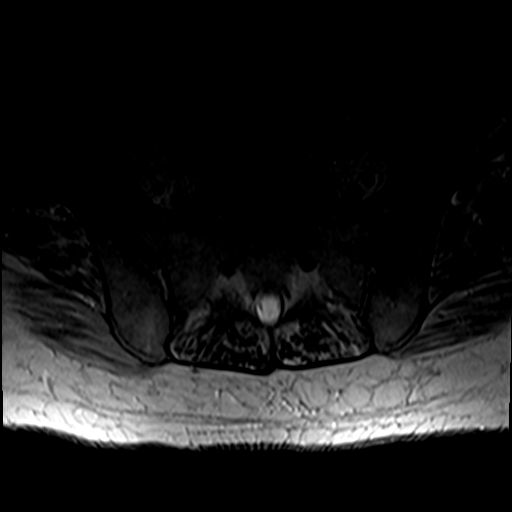
[im 7/47]
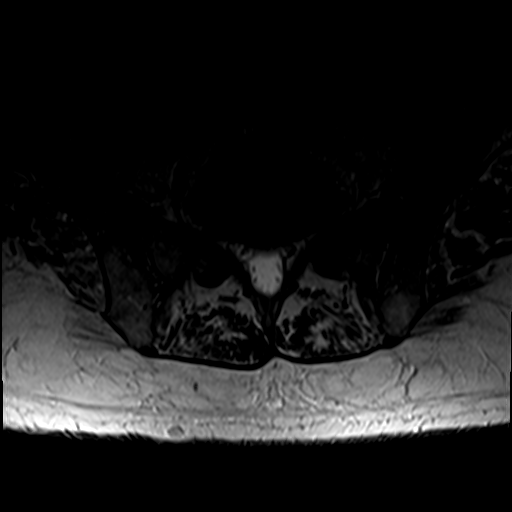
[im 10/47]
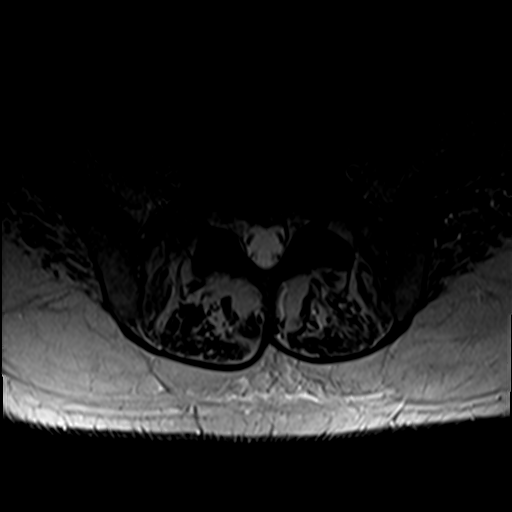
[im 16/47]
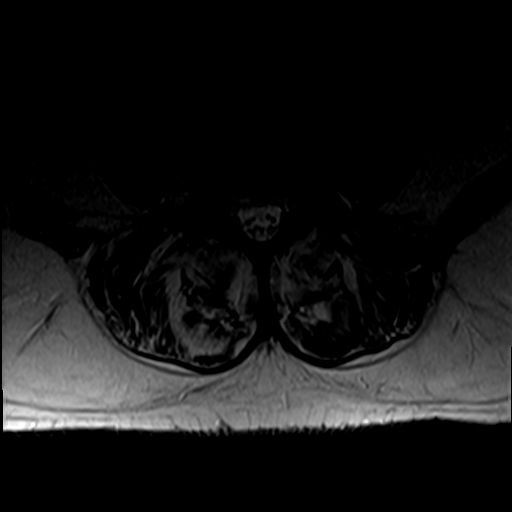
[im 22/47]
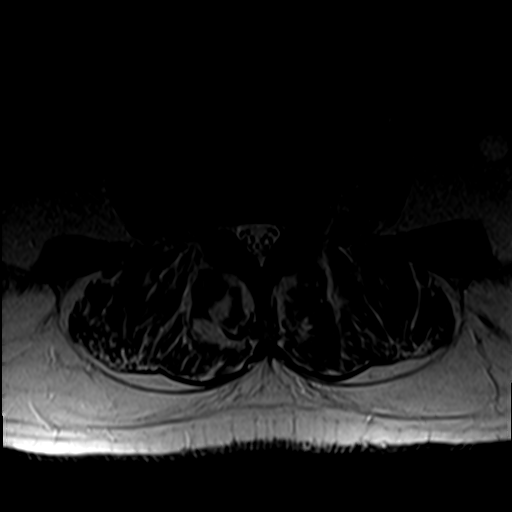
[im 25/47]
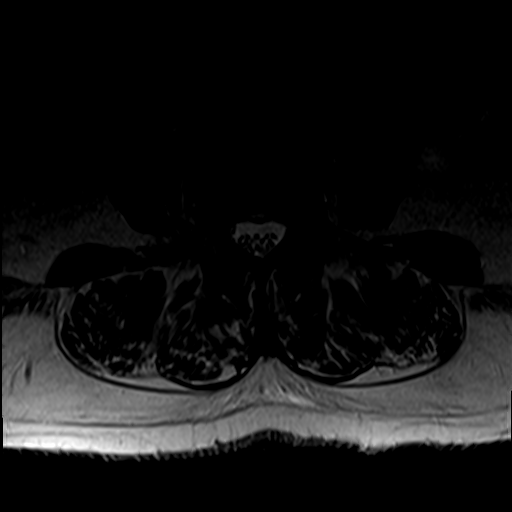
[im 28/47]
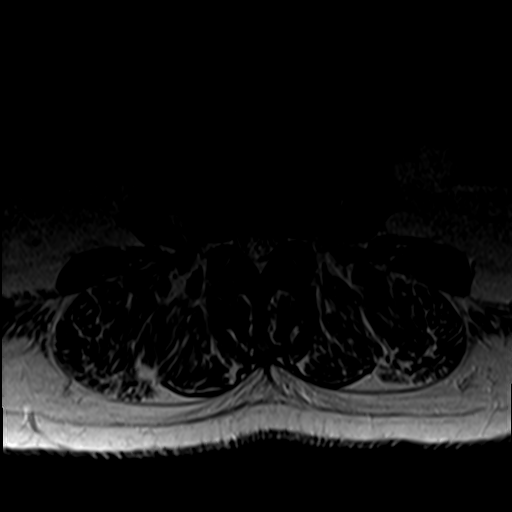
[im 34/47]
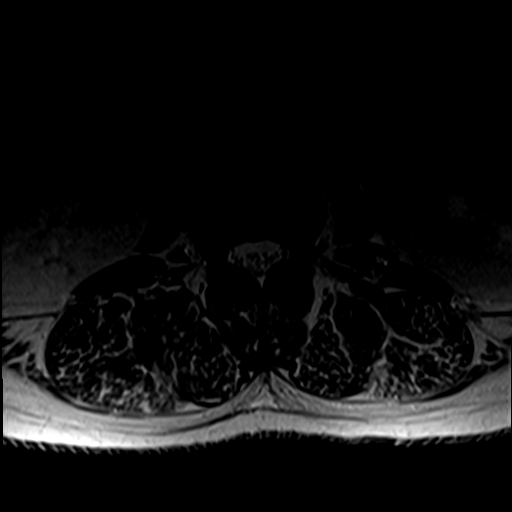
[im 40/47]
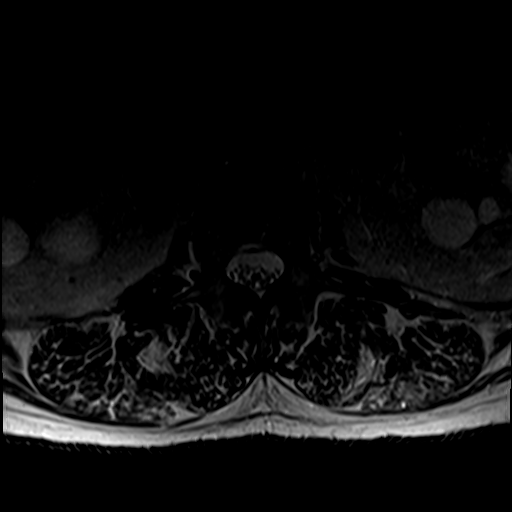
[im 47/47]
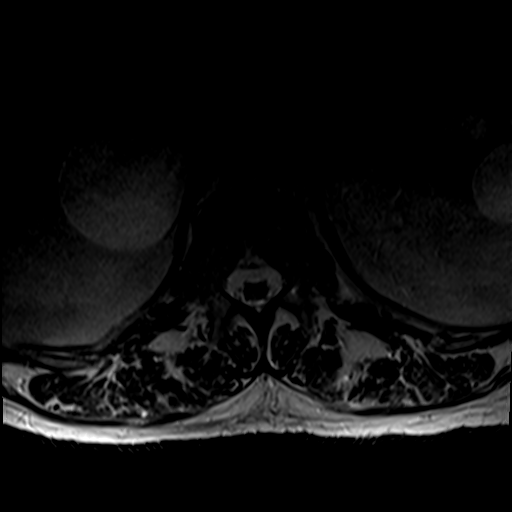

[Series 7: T1 · axial · 4.0mm · 0.39mm/px · z∈[-33,+149]mm · 5 of 47 slices shown (2 of 2)]
[im 4/47]
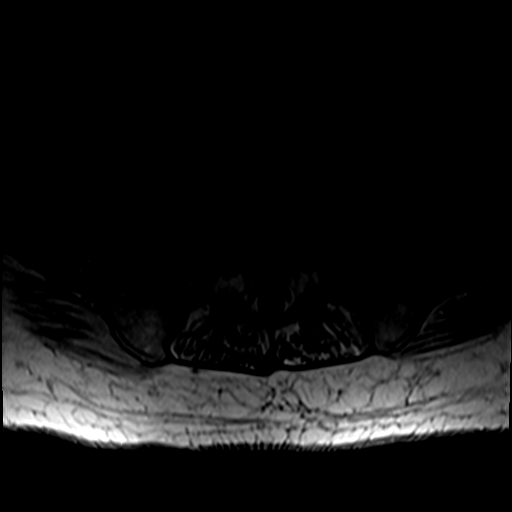
[im 7/47]
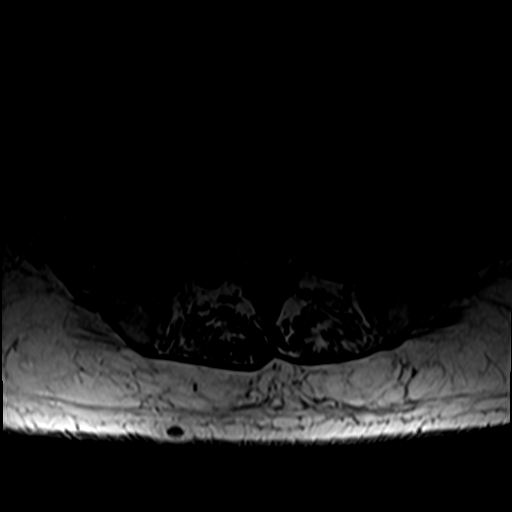
[im 10/47]
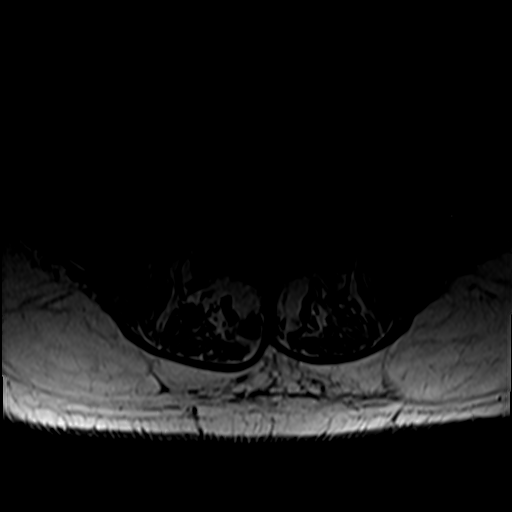
[im 25/47]
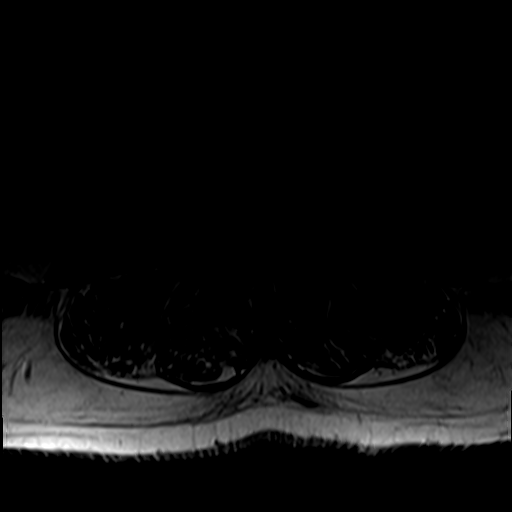
[im 40/47]
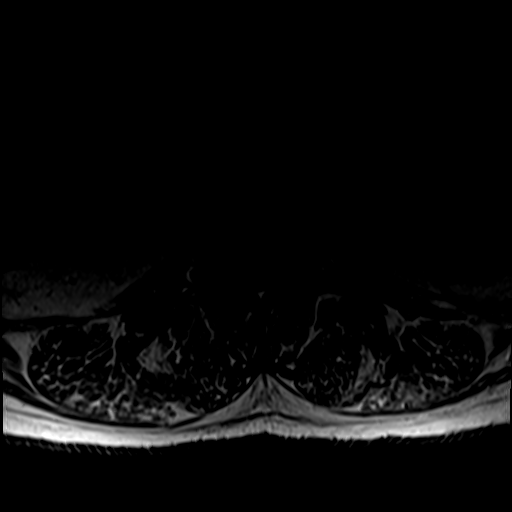

[26 of 48 positions shown; findings below may reference images not displayed]

FINDINGS: Segmentation:  Standard.

Alignment:  Physiologic.

Vertebrae:  No fracture, evidence of discitis, or bone lesion.

Conus medullaris and cauda equina: Conus extends to the L1 level.
Conus and cauda equina appear normal.

Paraspinal and other soft tissues: Mild paraspinal muscle atrophy.
There are multiple large bilateral renal cysts, similar to prior CT
in 0120.

Disc levels: There is multilevel disc desiccation and facet
arthritis.

T11-T12: Mild disc bulging resulting in no significant spinal canal
or neural foraminal narrowing.

T12-L1: No significant spinal canal or neural foraminal narrowing.

L1-L2: Right-sided foraminal disc protrusion and facet arthropathy
results in mild right neural foraminal narrowing.

L2-L3: Foraminal disc protrusions and facet arthropathy results in
mild bilateral neural foraminal narrowing.

L3-L4: Broad-based disc bulging, ligamentum flavum hypertrophy, and
facet arthropathy result in moderate bilateral neural foraminal
narrowing. Mild spinal canal narrowing.

L4-L5: Trace, grade 1 anterolisthesis. Broad-based disc bulging,
ligamentum flavum hypertrophy, and facet arthropathy result in
moderate-severe bilateral neural foraminal narrowing. Moderate
spinal canal narrowing. Significant left-sided facet effusion.

L5-S1: Mild disc bulging and facet arthropathy. Mild left neural
foraminal narrowing.
IMPRESSION: Multilevel degenerative changes of the lumbar spine, worst at L4-L5,
where there is grade 1 anterolisthesis, broad-based disc bulging and
facet degenerative change resulting in moderate-severe bilateral
neural foraminal narrowing and moderate spinal canal narrowing.
Left-sided facet effusion at L4-L5.

Moderate bilateral neural foraminal narrowing at L3-L4. Additional
degenerative changes with mild narrowing at multiple levels as
described above.
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
## 2022-10-11 ENCOUNTER — Encounter: Payer: Self-pay | Admitting: Physician Assistant

## 2022-10-11 ENCOUNTER — Ambulatory Visit: Payer: Medicare PPO | Attending: Physician Assistant | Admitting: Physician Assistant

## 2022-10-11 VITALS — BP 114/68 | HR 67 | Ht 60.0 in | Wt 220.6 lb

## 2022-10-11 DIAGNOSIS — I2510 Atherosclerotic heart disease of native coronary artery without angina pectoris: Secondary | ICD-10-CM | POA: Diagnosis not present

## 2022-10-11 DIAGNOSIS — I10 Essential (primary) hypertension: Secondary | ICD-10-CM

## 2022-10-11 DIAGNOSIS — E785 Hyperlipidemia, unspecified: Secondary | ICD-10-CM

## 2022-10-11 DIAGNOSIS — I452 Bifascicular block: Secondary | ICD-10-CM

## 2022-10-11 DIAGNOSIS — R600 Localized edema: Secondary | ICD-10-CM

## 2022-10-11 DIAGNOSIS — Z9861 Coronary angioplasty status: Secondary | ICD-10-CM

## 2022-10-11 DIAGNOSIS — E119 Type 2 diabetes mellitus without complications: Secondary | ICD-10-CM

## 2022-10-11 MED ORDER — ATORVASTATIN CALCIUM 40 MG PO TABS: 40.0000 mg | ORAL_TABLET | Freq: Every day | ORAL | 3 refills | Status: DC

## 2022-10-11 MED ORDER — NITROGLYCERIN 0.4 MG SL SUBL: 0.4000 mg | SUBLINGUAL_TABLET | SUBLINGUAL | 3 refills | Status: AC | PRN

## 2022-10-11 MED ORDER — FUROSEMIDE 20 MG PO TABS: 20.0000 mg | ORAL_TABLET | Freq: Every day | ORAL | 3 refills | Status: DC

## 2022-10-11 MED ORDER — METOPROLOL SUCCINATE ER 50 MG PO TB24: 50.0000 mg | ORAL_TABLET | Freq: Every day | ORAL | 3 refills | Status: DC

## 2022-10-11 NOTE — Patient Instructions (Signed)
Medication Instructions:  Your physician recommends that you continue on your current medications as directed. Please refer to the Current Medication list given to you today.  *If you need a refill on your cardiac medications before your next appointment, please call your pharmacy*    Follow-Up: At [facility], you and your health needs are our priority.  As part of our continuing mission to provide you with exceptional heart care, we have created designated Provider Care Teams.  These Care Teams include your primary Cardiologist (physician) and Advanced Practice Providers (APPs -  Physician Assistants and Nurse Practitioners) who all work together to provide you with the care you need, when you need it.  We recommend signing up for the patient portal called "MyChart".  Sign up information is provided on this After Visit Summary.  MyChart is used to connect with patients for Virtual Visits (Telemedicine).  Patients are able to view lab/test results, encounter notes, upcoming appointments, etc.  Non-urgent messages can be sent to your provider as well.   To learn more about what you can do with MyChart, go to [email or web address].    Your next appointment:   12 month(s)  Provider:   Peter Swaziland, MD  or Micah Flesher, PA-C

## 2022-10-14 ENCOUNTER — Telehealth: Payer: Self-pay

## 2022-10-14 NOTE — Telephone Encounter (Signed)
Received a [employer] DOT form.Dr.Jordan completed.Called patient he will pick up at front desk.

## 2022-11-06 LAB — LAB REPORT - SCANNED
A1c: 6.7
EGFR: 35

## 2023-04-12 ENCOUNTER — Encounter (HOSPITAL_BASED_OUTPATIENT_CLINIC_OR_DEPARTMENT_OTHER): Payer: Self-pay

## 2023-04-12 ENCOUNTER — Emergency Department (HOSPITAL_BASED_OUTPATIENT_CLINIC_OR_DEPARTMENT_OTHER): Payer: Medicare PPO

## 2023-04-12 ENCOUNTER — Emergency Department (HOSPITAL_BASED_OUTPATIENT_CLINIC_OR_DEPARTMENT_OTHER)
Admission: EM | Admit: 2023-04-12 | Discharge: 2023-04-12 | Disposition: A | Discharge status: Home / Self Care | Payer: Medicare PPO | Attending: Emergency Medicine | Admitting: Emergency Medicine

## 2023-04-12 ENCOUNTER — Other Ambulatory Visit: Payer: Self-pay

## 2023-04-12 DIAGNOSIS — N189 Chronic kidney disease, unspecified: Secondary | ICD-10-CM | POA: Diagnosis not present

## 2023-04-12 DIAGNOSIS — R059 Cough, unspecified: Secondary | ICD-10-CM | POA: Diagnosis present

## 2023-04-12 DIAGNOSIS — Z7982 Long term (current) use of aspirin: Secondary | ICD-10-CM | POA: Diagnosis not present

## 2023-04-12 DIAGNOSIS — J101 Influenza due to other identified influenza virus with other respiratory manifestations: Secondary | ICD-10-CM | POA: Insufficient documentation

## 2023-04-12 LAB — URINALYSIS, ROUTINE W REFLEX MICROSCOPIC
Bilirubin Urine: NEGATIVE
Glucose, UA: NEGATIVE mg/dL
Hgb urine dipstick: NEGATIVE
Ketones, ur: NEGATIVE mg/dL
Leukocytes,Ua: NEGATIVE
Nitrite: NEGATIVE
Protein, ur: NEGATIVE mg/dL
Specific Gravity, Urine: 1.01 (ref 1.005–1.030)
pH: 5.5 (ref 5.0–8.0)

## 2023-04-12 LAB — CBC
HCT: 43.3 % (ref 39.0–52.0)
Hemoglobin: 14.4 g/dL (ref 13.0–17.0)
MCH: 29.4 pg (ref 26.0–34.0)
MCHC: 33.3 g/dL (ref 30.0–36.0)
MCV: 88.4 fL (ref 80.0–100.0)
Platelets: 56 10*3/uL — ABNORMAL LOW (ref 150–400)
RBC: 4.9 MIL/uL (ref 4.22–5.81)
RDW: 13.8 % (ref 11.5–15.5)
WBC: 7.4 10*3/uL (ref 4.0–10.5)
nRBC: 0 % (ref 0.0–0.2)

## 2023-04-12 LAB — COMPREHENSIVE METABOLIC PANEL
ALT: 19 U/L (ref 0–44)
AST: 24 U/L (ref 15–41)
Albumin: 3.1 g/dL — ABNORMAL LOW (ref 3.5–5.0)
Alkaline Phosphatase: 64 U/L (ref 38–126)
Anion gap: 6 (ref 5–15)
BUN: 46 mg/dL — ABNORMAL HIGH (ref 8–23)
CO2: 25 mmol/L (ref 22–32)
Calcium: 8.9 mg/dL (ref 8.9–10.3)
Chloride: 103 mmol/L (ref 98–111)
Creatinine, Ser: 2.48 mg/dL — ABNORMAL HIGH (ref 0.61–1.24)
GFR, Estimated: 25 mL/min — ABNORMAL LOW (ref >60)
Glucose, Bld: 122 mg/dL — ABNORMAL HIGH (ref 70–99)
Potassium: 4.5 mmol/L (ref 3.5–5.1)
Sodium: 134 mmol/L — ABNORMAL LOW (ref 135–145)
Total Bilirubin: 0.8 mg/dL (ref 0.0–1.2)
Total Protein: 5.6 g/dL — ABNORMAL LOW (ref 6.5–8.1)

## 2023-04-12 LAB — LIPASE, BLOOD: Lipase: 29 U/L (ref 11–51)

## 2023-04-12 LAB — RESP PANEL BY RT-PCR (RSV, FLU A&B, COVID)  RVPGX2
Influenza A by PCR: POSITIVE — AB
Influenza B by PCR: NEGATIVE
Resp Syncytial Virus by PCR: NEGATIVE
SARS Coronavirus 2 by RT PCR: NEGATIVE

## 2023-04-12 MED ORDER — BENZONATATE 100 MG PO CAPS
100.0000 mg | ORAL_CAPSULE | Freq: Once | ORAL | Status: AC
  Administered 2023-04-12: 100 mg via ORAL
  Filled 2023-04-12: qty 1

## 2023-04-12 MED ORDER — SODIUM CHLORIDE 0.9 % IV BOLUS
1000.0000 mL | Freq: Once | INTRAVENOUS | Status: AC
Start: 2023-04-12 — End: 2023-04-12
  Administered 2023-04-12: 1000 mL via INTRAVENOUS

## 2023-04-12 NOTE — ED Provider Notes (Signed)
[facility] [facility] Provider Note   CSN: [id number] Arrival date & time: 04/12/23  1817     History  Chief Complaint  Patient presents with   Influenza    Jesus [name] is a 83 y.o. male past medical history significant for MI and CKD presents today for coughing and flulike symptoms since last Thursday.  Patient began having diarrhea on Saturday.  Patient went to urgent care who advised the patient to come to the ED after concerns for dehydration.  Patient's diarrhea resolved yesterday.  Patient also endorses body aches, congestion, and fatigue.  Patient denies shortness of breath, chest pain, nausea, vomiting, weakness, or abdominal pain.   Influenza Presenting symptoms: cough, diarrhea and rhinorrhea   Associated symptoms: nasal congestion        Home Medications Prior to Admission medications   Medication Sig Start Date End Date Taking? Authorizing Provider  aspirin 81 MG tablet Take 81 mg by mouth every morning.    [provider]  atorvastatin (LIPITOR) 40 MG tablet Take 1 tablet (40 mg total) by mouth daily at 6 PM. 10/11/22   [facility], Roe Rutherford, PA  BD PEN NEEDLE NANO 2ND GEN 32G X 4 MM MISC See admin instructions. 09/18/22   [provider]  Cephalexin 250 MG tablet  08/09/19   [provider]  Continuous Blood Gluc Transmit (DEXCOM G6 TRANSMITTER) MISC  03/15/19   [provider]  Ferrous Sulfate (IRON) 325 (65 FE) MG TABS Take 325 mg by mouth at bedtime.    [provider]  FIASP FLEXTOUCH 100 UNIT/ML FlexTouch Pen  08/08/19   [provider]  furosemide (LASIX) 20 MG tablet Take 1 tablet (20 mg total) by mouth daily. 10/11/22   [facility], Roe Rutherford, PA  glimepiride (AMARYL) 2 MG tablet Take 2 mg by mouth daily with breakfast.    [provider]  Iron-FA-B Cmp-C-Biot-Probiotic (FUSION PLUS) CAPS Take 1 capsule by mouth daily. 10/08/22   [provider]  metoprolol  succinate (TOPROL-XL) 50 MG 24 hr tablet Take 1 tablet (50 mg total) by mouth daily. Take with or immediately following a meal. 10/11/22   [facility], Roe Rutherford, PA  Multiple Vitamin (MULTIVITAMIN WITH MINERALS) TABS Take 1 tablet by mouth daily.    [provider]  nitroGLYCERIN (NITROSTAT) 0.4 MG SL tablet Place 1 tablet (0.4 mg total) under the tongue every 5 (five) minutes as needed. For chest pain 10/11/22   [facility], Roe Rutherford, PA  [facility] VERIO test strip  04/11/19   [provider]  telmisartan (MICARDIS) 80 MG tablet Take 80 mg by mouth daily. 08/21/22   [provider]  TRESIBA FLEXTOUCH 100 UNIT/ML FlexTouch Pen Inject 10 Units into the skin daily before breakfast. 04/20/22   [provider]  Vibegron (GEMTESA) 75 MG TABS Take 75 mg by mouth.    [provider]      Allergies    Onion, Camphor, and Menthol    Review of Systems   Review of Systems  HENT:  Positive for congestion and rhinorrhea.   Respiratory:  Positive for cough.   Gastrointestinal:  Positive for diarrhea.    Physical Exam Updated Vital Signs BP (!) 136/56   Pulse 72   Temp 99.2 F (37.3 C)   Resp (!) 22   Wt 100.2 kg   SpO2 98%   BMI 43.16 kg/m  Physical Exam Vitals and nursing note reviewed.  Constitutional:  General: He is not in acute distress.    Appearance: He is well-developed. He is ill-appearing. He is not toxic-appearing or diaphoretic.  HENT:     Head: Normocephalic and atraumatic.     Right Ear: External ear normal.     Left Ear: External ear normal.     Nose: Congestion and rhinorrhea present.     Mouth/Throat:     Mouth: Mucous membranes are moist.     Pharynx: Oropharynx is clear.  Eyes:     Extraocular Movements: Extraocular movements intact.     Conjunctiva/sclera: Conjunctivae normal.  Cardiovascular:     Rate and Rhythm: Normal rate and regular rhythm.     Pulses: Normal pulses.     Heart sounds: Normal heart sounds. No murmur  heard. Pulmonary:     Effort: Pulmonary effort is normal. No respiratory distress.     Breath sounds: Rhonchi present.  Abdominal:     Palpations: Abdomen is soft.     Tenderness: There is no abdominal tenderness.  Musculoskeletal:        General: No swelling.     Cervical back: Neck supple.     Right lower leg: No edema.     Left lower leg: No edema.  Skin:    General: Skin is warm and dry.     Capillary Refill: Capillary refill takes less than 2 seconds.  Neurological:     General: No focal deficit present.     Mental Status: He is alert.     Motor: No weakness.  Psychiatric:        Mood and Affect: Mood normal.     ED Results / Procedures / Treatments   Labs (all labs ordered are listed, but only abnormal results are displayed) Labs Reviewed  RESP PANEL BY RT-PCR (RSV, FLU A&B, COVID)  RVPGX2 - Abnormal; Notable for the following components:      Result Value   Influenza A by PCR POSITIVE (*)    All other components within normal limits  COMPREHENSIVE METABOLIC PANEL - Abnormal; Notable for the following components:   Sodium 134 (*)    Glucose, Bld 122 (*)    BUN 46 (*)    Creatinine, Ser 2.48 (*)    Total Protein 5.6 (*)    Albumin 3.1 (*)    GFR, Estimated 25 (*)    All other components within normal limits  CBC - Abnormal; Notable for the following components:   Platelets 56 (*)    All other components within normal limits  LIPASE, BLOOD  URINALYSIS, ROUTINE W REFLEX MICROSCOPIC    EKG EKG Interpretation Date/Time:  Tuesday April 12 2023 18:52:04 EST Ventricular Rate:  70 PR Interval:  165 QRS Duration:  132 QT Interval:  413 QTC Calculation: 446 R Axis:   -40  Text Interpretation: Sinus rhythm RBBB and LAFB Confirmed by Alvester Chou [phone]) on 04/12/2023 7:53:09 PM  Radiology DG Chest 2 View Result Date: 04/12/2023 CLINICAL DATA:  Cough EXAM: CHEST - 2 VIEW COMPARISON:  12/10/2014 FINDINGS: Streaky bibasilar atelectasis or scarring. No pleural  effusion. Stable cardiomediastinal silhouette. No pneumothorax. Left shoulder replacement IMPRESSION: Streaky bibasilar atelectasis or scarring. Electronically Signed   By: Jasmine Pang M.D.   On: 04/12/2023 20:00    Procedures Procedures    Medications Ordered in ED Medications  sodium chloride 0.9 % bolus 1,000 mL (1,000 mLs Intravenous New Bag/Given 04/12/23 2123)  benzonatate (TESSALON) capsule 100 mg (100 mg Oral Given 04/12/23 2123)  ED Course/ Medical Decision Making/ A&P Clinical Course as of 04/12/23 2231  Tue Apr 12, 2023  2054 This is an 83 year old male with a history of chronic kidney disease presenting to the ED on day 5 of flulike symptoms, found to be flu positive.  He has been having some nausea, 2 days of loose bowel movements, and generalized weakness.  Labs today were notable for elevated BUN/creatinine, creatinine 2.4.  Case was discussed with the on-call nephrologist and the PA provider they report to review of his outpatient nephrology notes, with a baseline recent creatinine near 1.9.  Nephrologist was comfortable with plan for IV hydration, holding Lasix for 2 days, and close outpatient follow-up, which would be the patient's preference to avoid hospitalization.  The patient is easily able to tolerate fluids at the bedside.  He will be given 1 L of fluid and these instructions will be provided for discharge.  His family members here to take him home.  Blood pressure is low normal but stable for this patient [MT]  2055 He does have a persistent dry cough.  No evident focal infiltrate on his chest x-ray, or hypoxia, or leukocytosis to suspect bacterial infection.  I do not see an indication for antibiotics at this time.  He is also outside the window for Tamiflu. [MT]    Clinical Course User Index [MT] Trifan, Kermit Balo, MD                                 Medical Decision Making Amount and/or Complexity of Data Reviewed Labs: ordered. Radiology: ordered.   This  patient presents to the ED with chief complaint(s) of URI symptoms with pertinent past medical history of MI which further complicates the presenting complaint. The complaint involves an extensive differential diagnosis and also carries with it a high risk of complications and morbidity.    The differential diagnosis includes COVID, flu, RSV, pneumonia, URI  Additional history obtained: Additional history obtained from family Records reviewed Care Everywhere/External Records  ED Course and Reassessment: Patient given 1 L normal saline Patient able to tolerate p.o. intake prior to discharge.  Independent labs interpretation:  The following labs were independently interpreted:  EKG: Sinus rhythm with RBBB and LAFB CBC: Low platelets at 56 CMP: Mild hyponatremia 134, elevated bun at 46, elevated creatinine at 2.48 Lipase: 29 Respiratory panel: Influenza A positive UA: No notable findings  Independent visualization of imaging: - I independently visualized the following imaging with scope of interpretation limited to determining acute life threatening conditions related to emergency care: Chest x-ray, which revealed streaky bibasilar atelectasis or scarring  Consultation: - Consulted or discussed management/test interpretation w/ external professional: Nephrology, Dr. Malen Gauze who suggested giving the patient 1 L normal saline, ensuring patient can tolerate p.o. intake, and holding his Lasix for 1 to 2 days.  She stated she would put in a request for hospital follow-up in the upcoming days.  Consideration for admission or further workup: Consider for admission or further workup however patient's vital signs, physical exam, labs, and imaging were reassuring.  Patient's symptoms likely due to acute influenza A infection.  Patient able to tolerate p.o. intake prior to discharge.  After consulting with nephrology, I will asked the patient to hold his Lasix for 2 days and have close follow-up with  nephrology in the upcoming days.        Final Clinical Impression(s) / ED Diagnoses Final diagnoses:  Influenza A    Rx / DC Orders ED Discharge Orders     None         Gretta Began 04/12/23 2231    Terald Sleeper, MD 04/13/23 1356

## 2023-04-12 NOTE — ED Notes (Signed)
Called [facility] For on call Doctor PA waiting for a call back Called @ 20:38

## 2023-04-12 NOTE — ED Triage Notes (Signed)
Py reports coughing and flu symptoms last Thursday. Diarrhea since Saturday Seen at Urgent care and concerned for dehydration.  Pain upper abdomen from coughing Denies SOB. No diarrhea since yesterday

## 2023-04-12 NOTE — Discharge Instructions (Addendum)
Today you are seen for influenza A infection.  Please maintain adequate hydration.  Please do not take your Lasix for the next 2 days to help maintain hydration.  Follow-up with your nephrologist as soon as possible for further evaluation and possible treatment.  Thank you for letting us treat you today. After reviewing your labs and imaging, I feel you are safe to go home. Please follow up with your PCP in the next several days and provide them with your records from this visit. Return to the Emergency Room if pain becomes severe or symptoms worsen.

## 2023-11-18 ENCOUNTER — Other Ambulatory Visit: Payer: Self-pay

## 2023-11-18 DIAGNOSIS — I2510 Atherosclerotic heart disease of native coronary artery without angina pectoris: Secondary | ICD-10-CM

## 2023-11-18 MED ORDER — ATORVASTATIN CALCIUM 40 MG PO TABS: 40.0000 mg | ORAL_TABLET | Freq: Every day | ORAL | 0 refills | Status: DC
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
## 2023-12-07 ENCOUNTER — Ambulatory Visit: Admitting: Physician Assistant

## 2023-12-22 ENCOUNTER — Other Ambulatory Visit: Payer: Self-pay

## 2023-12-22 DIAGNOSIS — I2510 Atherosclerotic heart disease of native coronary artery without angina pectoris: Secondary | ICD-10-CM

## 2023-12-26 MED ORDER — FUROSEMIDE 20 MG PO TABS: 20.0000 mg | ORAL_TABLET | Freq: Every day | ORAL | 0 refills | Status: DC

## 2023-12-26 MED ORDER — ATORVASTATIN CALCIUM 40 MG PO TABS: 40.0000 mg | ORAL_TABLET | Freq: Every day | ORAL | 0 refills | Status: DC
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
## 2023-12-28 ENCOUNTER — Encounter: Payer: Self-pay | Admitting: Physician Assistant

## 2023-12-28 ENCOUNTER — Ambulatory Visit: Attending: Cardiology | Admitting: Physician Assistant

## 2023-12-28 VITALS — BP 108/64 | HR 68 | Ht 67.0 in | Wt 214.0 lb

## 2023-12-28 DIAGNOSIS — E785 Hyperlipidemia, unspecified: Secondary | ICD-10-CM

## 2023-12-28 DIAGNOSIS — I452 Bifascicular block: Secondary | ICD-10-CM

## 2023-12-28 DIAGNOSIS — R0609 Other forms of dyspnea: Secondary | ICD-10-CM

## 2023-12-28 DIAGNOSIS — I10 Essential (primary) hypertension: Secondary | ICD-10-CM

## 2023-12-28 DIAGNOSIS — I2510 Atherosclerotic heart disease of native coronary artery without angina pectoris: Secondary | ICD-10-CM | POA: Diagnosis not present

## 2023-12-28 DIAGNOSIS — N184 Chronic kidney disease, stage 4 (severe): Secondary | ICD-10-CM

## 2023-12-28 DIAGNOSIS — Z9861 Coronary angioplasty status: Secondary | ICD-10-CM

## 2023-12-28 MED ORDER — FUROSEMIDE 20 MG PO TABS: 20.0000 mg | ORAL_TABLET | Freq: Every day | ORAL | 3 refills | Status: DC

## 2023-12-28 NOTE — Patient Instructions (Signed)
 Medication Instructions:  Take 40 mg of Furosemide  (Lasix ) for 3 days then drop back to 20 mg.  *If you need a refill on your cardiac medications before your next appointment, please call your pharmacy*  Lab Work: BMET, BNP If you have labs (blood work) drawn today and your tests are completely normal, you will receive your results only by: MyChart Message (if you have MyChart) OR A paper copy in the mail If you have any lab test that is abnormal or we need to change your treatment, we will call you to review the results.  Testing/Procedures: Your physician has requested that you have an echocardiogram. Echocardiography is a painless test that uses sound waves to create images of your heart. It provides your doctor with information about the size and shape of your heart and how well your heart's chambers and valves are working. This procedure takes approximately one hour. There are no restrictions for this procedure. Please do NOT wear cologne, perfume, aftershave, or lotions (deodorant is allowed). Please arrive 15 minutes prior to your appointment time.  Please note: We ask at that you not bring children with you during ultrasound (echo/ vascular) testing. Due to room size and safety concerns, children are not allowed in the ultrasound rooms during exams. Our front office staff cannot provide observation of children in our lobby area while testing is being conducted. An adult accompanying a patient to their appointment will only be allowed in the ultrasound room at the discretion of the ultrasound technician under special circumstances. We apologize for any inconvenience.   Follow-Up: At [facility], you and your health needs are our priority.  As part of our continuing mission to provide you with exceptional heart care, our providers are all part of one team.  This team includes your primary Cardiologist (physician) and Advanced Practice Providers or APPs (Physician Assistants and  Nurse Practitioners) who all work together to provide you with the care you need, when you need it.  Your next appointment:   2 month(s)  Provider:   Peter Jordan, MD   We recommend signing up for the patient portal called MyChart.  Sign up information is provided on this After Visit Summary.  MyChart is used to connect with patients for Virtual Visits (Telemedicine).  Patients are able to view lab/test results, encounter notes, upcoming appointments, etc.  Non-urgent messages can be sent to your provider as well.   To learn more about what you can do with MyChart, go to [email or web address].

## 2023-12-29 ENCOUNTER — Ambulatory Visit: Payer: Self-pay | Admitting: Physician Assistant

## 2023-12-29 LAB — BASIC METABOLIC PANEL WITH GFR
BUN/Creatinine Ratio: 19 (ref 10–24)
BUN: 41 mg/dL — ABNORMAL HIGH (ref 8–27)
CO2: 24 mmol/L (ref 20–29)
Calcium: 9.5 mg/dL (ref 8.6–10.2)
Chloride: 104 mmol/L (ref 96–106)
Creatinine, Ser: 2.17 mg/dL — ABNORMAL HIGH (ref 0.76–1.27)
Glucose: 81 mg/dL (ref 70–99)
Potassium: 4.7 mmol/L (ref 3.5–5.2)
Sodium: 144 mmol/L (ref 134–144)
eGFR: 30 mL/min/1.73 — ABNORMAL LOW (ref >59)

## 2023-12-29 LAB — BRAIN NATRIURETIC PEPTIDE: BNP: 103.5 pg/mL — ABNORMAL HIGH (ref 0.0–100.0)

## 2024-01-11 ENCOUNTER — Ambulatory Visit (HOSPITAL_COMMUNITY)
Admission: RE | Admit: 2024-01-11 | Discharge: 2024-01-11 | Disposition: A | Discharge status: Home / Self Care | Source: Ambulatory Visit | Attending: Internal Medicine | Admitting: Internal Medicine

## 2024-01-11 DIAGNOSIS — R0609 Other forms of dyspnea: Secondary | ICD-10-CM | POA: Diagnosis present

## 2024-01-11 LAB — ECHOCARDIOGRAM COMPLETE
Area-P 1/2: 3.45 cm2
S' Lateral: 2.7 cm

## 2024-01-24 ENCOUNTER — Other Ambulatory Visit: Payer: Self-pay | Admitting: Cardiology

## 2024-01-24 DIAGNOSIS — I2510 Atherosclerotic heart disease of native coronary artery without angina pectoris: Secondary | ICD-10-CM
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
## 2024-03-16 ENCOUNTER — Encounter: Payer: Self-pay | Admitting: Cardiology

## 2024-03-16 ENCOUNTER — Ambulatory Visit: Admitting: Cardiology

## 2024-03-16 VITALS — BP 96/59 | HR 70 | Ht 67.0 in | Wt 202.8 lb

## 2024-03-16 DIAGNOSIS — I10 Essential (primary) hypertension: Secondary | ICD-10-CM

## 2024-03-16 DIAGNOSIS — N184 Chronic kidney disease, stage 4 (severe): Secondary | ICD-10-CM

## 2024-03-16 DIAGNOSIS — I951 Orthostatic hypotension: Secondary | ICD-10-CM | POA: Diagnosis not present

## 2024-03-16 DIAGNOSIS — I25118 Atherosclerotic heart disease of native coronary artery with other forms of angina pectoris: Secondary | ICD-10-CM | POA: Diagnosis not present

## 2024-03-16 DIAGNOSIS — E119 Type 2 diabetes mellitus without complications: Secondary | ICD-10-CM | POA: Diagnosis not present

## 2024-03-16 MED ORDER — METOPROLOL SUCCINATE ER 50 MG PO TB24: 25.0000 mg | ORAL_TABLET | Freq: Every day | ORAL | 3 refills | Status: AC

## 2024-03-16 NOTE — Patient Instructions (Addendum)
 Medication Instructions:  Stop Lasix  Decrease Toprol  XL to 25 mg daily Continue all other medications *If you need a refill on your cardiac medications before your next appointment, please call your pharmacy*  Lab Work: None ordered   Testing/Procedures: None ordered  Follow-Up: At [facility], you and your health needs are our priority.  As part of our continuing mission to provide you with exceptional heart care, our providers are all part of one team.  This team includes your primary Cardiologist (physician) and Advanced Practice Providers or APPs (Physician Assistants and Nurse Practitioners) who all work together to provide you with the care you need, when you need it.  Your next appointment:  6 months    Call in April to schedule July appointment     Provider:  Dr.Jordan   We recommend signing up for the patient portal called MyChart.  Sign up information is provided on this After Visit Summary.  MyChart is used to connect with patients for Virtual Visits (Telemedicine).  Patients are able to view lab/test results, encounter notes, upcoming appointments, etc.  Non-urgent messages can be sent to your provider as well.   To learn more about what you can do with MyChart, go to [email or web address].
